# Patient Record
Sex: Male | Born: 1945 | Race: White | Hispanic: No | Marital: Married | Smoking: Former smoker
Health system: Southern US, Community
[De-identification: ages and names within clinical notes are randomized; demographics above are authoritative.]

## PROBLEM LIST (undated history)

## (undated) DIAGNOSIS — E785 Hyperlipidemia, unspecified: Secondary | ICD-10-CM

## (undated) DIAGNOSIS — I10 Essential (primary) hypertension: Secondary | ICD-10-CM

## (undated) DIAGNOSIS — I219 Acute myocardial infarction, unspecified: Secondary | ICD-10-CM

## (undated) DIAGNOSIS — C61 Malignant neoplasm of prostate: Secondary | ICD-10-CM

## (undated) DIAGNOSIS — C801 Malignant (primary) neoplasm, unspecified: Secondary | ICD-10-CM

## (undated) DIAGNOSIS — Z8489 Family history of other specified conditions: Secondary | ICD-10-CM

## (undated) DIAGNOSIS — S83289A Other tear of lateral meniscus, current injury, unspecified knee, initial encounter: Secondary | ICD-10-CM

## (undated) DIAGNOSIS — N2889 Other specified disorders of kidney and ureter: Secondary | ICD-10-CM

## (undated) DIAGNOSIS — M1990 Unspecified osteoarthritis, unspecified site: Secondary | ICD-10-CM

## (undated) DIAGNOSIS — I2510 Atherosclerotic heart disease of native coronary artery without angina pectoris: Secondary | ICD-10-CM

## (undated) HISTORY — DX: Essential (primary) hypertension: I10

## (undated) HISTORY — DX: Atherosclerotic heart disease of native coronary artery without angina pectoris: I25.10

## (undated) HISTORY — PX: ROBOT ASSISTED LAPAROSCOPIC RADICAL PROSTATECTOMY: SHX5141

## (undated) HISTORY — PX: DIAGNOSTIC LAPAROSCOPY: SUR761

## (undated) HISTORY — PX: CARDIAC CATHETERIZATION: SHX172

## (undated) HISTORY — DX: Other tear of lateral meniscus, current injury, unspecified knee, initial encounter: S83.289A

## (undated) HISTORY — PX: CORONARY ANGIOPLASTY: SHX604

## (undated) HISTORY — DX: Hyperlipidemia, unspecified: E78.5

---

## 2009-02-17 ENCOUNTER — Inpatient Hospital Stay: Payer: Self-pay | Admitting: Internal Medicine

## 2010-06-06 ENCOUNTER — Ambulatory Visit: Payer: Self-pay | Admitting: Gastroenterology

## 2014-10-16 DIAGNOSIS — I10 Essential (primary) hypertension: Secondary | ICD-10-CM

## 2014-10-16 HISTORY — DX: Essential (primary) hypertension: I10

## 2014-10-21 DIAGNOSIS — IMO0002 Reserved for concepts with insufficient information to code with codable children: Secondary | ICD-10-CM | POA: Insufficient documentation

## 2014-11-06 ENCOUNTER — Other Ambulatory Visit: Payer: Self-pay | Admitting: Surgery

## 2014-11-06 DIAGNOSIS — S83271A Complex tear of lateral meniscus, current injury, right knee, initial encounter: Secondary | ICD-10-CM

## 2014-11-06 DIAGNOSIS — M25561 Pain in right knee: Secondary | ICD-10-CM

## 2014-11-06 DIAGNOSIS — S83289A Other tear of lateral meniscus, current injury, unspecified knee, initial encounter: Secondary | ICD-10-CM

## 2014-11-06 HISTORY — DX: Other tear of lateral meniscus, current injury, unspecified knee, initial encounter: S83.289A

## 2014-11-12 ENCOUNTER — Ambulatory Visit
Admission: RE | Admit: 2014-11-12 | Discharge: 2014-11-12 | Disposition: A | Discharge status: Home / Self Care | Payer: Medicare Other | Source: Ambulatory Visit | Attending: Surgery | Admitting: Surgery

## 2014-11-12 DIAGNOSIS — T1490 Injury, unspecified: Secondary | ICD-10-CM | POA: Diagnosis not present

## 2014-11-12 DIAGNOSIS — S83251A Bucket-handle tear of lateral meniscus, current injury, right knee, initial encounter: Secondary | ICD-10-CM | POA: Diagnosis not present

## 2014-11-12 DIAGNOSIS — S83271A Complex tear of lateral meniscus, current injury, right knee, initial encounter: Secondary | ICD-10-CM

## 2014-11-12 DIAGNOSIS — M1711 Unilateral primary osteoarthritis, right knee: Secondary | ICD-10-CM | POA: Diagnosis not present

## 2014-11-12 DIAGNOSIS — M25561 Pain in right knee: Secondary | ICD-10-CM | POA: Diagnosis present

## 2014-11-12 DIAGNOSIS — M7121 Synovial cyst of popliteal space [Baker], right knee: Secondary | ICD-10-CM | POA: Insufficient documentation

## 2014-11-17 DIAGNOSIS — S83249A Other tear of medial meniscus, current injury, unspecified knee, initial encounter: Secondary | ICD-10-CM | POA: Insufficient documentation

## 2014-11-30 ENCOUNTER — Other Ambulatory Visit: Payer: Self-pay

## 2014-11-30 ENCOUNTER — Encounter
Admission: RE | Admit: 2014-11-30 | Discharge: 2014-11-30 | Disposition: A | Discharge status: Home / Self Care | Payer: Medicare Other | Source: Ambulatory Visit | Attending: Surgery | Admitting: Surgery

## 2014-11-30 DIAGNOSIS — E785 Hyperlipidemia, unspecified: Secondary | ICD-10-CM | POA: Diagnosis not present

## 2014-11-30 DIAGNOSIS — S83289A Other tear of lateral meniscus, current injury, unspecified knee, initial encounter: Secondary | ICD-10-CM | POA: Diagnosis present

## 2014-11-30 DIAGNOSIS — Z8249 Family history of ischemic heart disease and other diseases of the circulatory system: Secondary | ICD-10-CM | POA: Diagnosis not present

## 2014-11-30 DIAGNOSIS — M179 Osteoarthritis of knee, unspecified: Secondary | ICD-10-CM | POA: Diagnosis not present

## 2014-11-30 DIAGNOSIS — I10 Essential (primary) hypertension: Secondary | ICD-10-CM | POA: Diagnosis not present

## 2014-11-30 DIAGNOSIS — Z7982 Long term (current) use of aspirin: Secondary | ICD-10-CM | POA: Diagnosis not present

## 2014-11-30 DIAGNOSIS — Z87891 Personal history of nicotine dependence: Secondary | ICD-10-CM | POA: Diagnosis not present

## 2014-11-30 DIAGNOSIS — M23261 Derangement of other lateral meniscus due to old tear or injury, right knee: Secondary | ICD-10-CM | POA: Diagnosis not present

## 2014-11-30 DIAGNOSIS — I2510 Atherosclerotic heart disease of native coronary artery without angina pectoris: Secondary | ICD-10-CM | POA: Diagnosis not present

## 2014-11-30 DIAGNOSIS — M23221 Derangement of posterior horn of medial meniscus due to old tear or injury, right knee: Secondary | ICD-10-CM | POA: Diagnosis not present

## 2014-11-30 DIAGNOSIS — I252 Old myocardial infarction: Secondary | ICD-10-CM | POA: Diagnosis not present

## 2014-11-30 DIAGNOSIS — M94261 Chondromalacia, right knee: Secondary | ICD-10-CM | POA: Diagnosis not present

## 2014-11-30 DIAGNOSIS — Z79899 Other long term (current) drug therapy: Secondary | ICD-10-CM | POA: Diagnosis not present

## 2014-11-30 HISTORY — DX: Family history of other specified conditions: Z84.89

## 2014-11-30 HISTORY — DX: Unspecified osteoarthritis, unspecified site: M19.90

## 2014-11-30 HISTORY — DX: Atherosclerotic heart disease of native coronary artery without angina pectoris: I25.10

## 2014-11-30 HISTORY — DX: Acute myocardial infarction, unspecified: I21.9

## 2014-11-30 HISTORY — DX: Essential (primary) hypertension: I10

## 2014-11-30 LAB — CBC
HCT: 39.9 % — ABNORMAL LOW (ref 40.0–52.0)
Hemoglobin: 13.8 g/dL (ref 13.0–18.0)
MCH: 28.6 pg (ref 26.0–34.0)
MCHC: 34.5 g/dL (ref 32.0–36.0)
MCV: 82.8 fL (ref 80.0–100.0)
PLATELETS: 200 10*3/uL (ref 150–440)
RBC: 4.82 MIL/uL (ref 4.40–5.90)
RDW: 14.2 % (ref 11.5–14.5)
WBC: 8.4 10*3/uL (ref 3.8–10.6)

## 2014-11-30 LAB — POTASSIUM: Potassium: 4.1 mmol/L (ref 3.5–5.1)

## 2014-11-30 NOTE — Patient Instructions (Signed)
  Your procedure is scheduled on: 12/03/14 Report to Day Surgery. To find out your arrival time please call [phone] between 1PM - 3PM on 12/02/14.  Remember: Instructions that are not followed completely may result in serious medical risk, up to and including death, or upon the discretion of your surgeon and anesthesiologist your surgery may need to be rescheduled.    ____ 1. Do not eat food or drink liquids after midnight. No gum chewing or hard candies.     ____ 2. No Alcohol for 24 hours before or after surgery.   ____ 3. Bring all medications with you on the day of surgery if instructed.    ____ 4. Notify your doctor if there is any change in your medical condition     (cold, fever, infections).     Do not wear jewelry, make-up, hairpins, clips or nail polish.  Do not wear lotions, powders, or perfumes. You may wear deodorant.  Do not shave 48 hours prior to surgery. Men may shave face and neck.  Do not bring valuables to the hospital.    [facility] is not responsible for any belongings or valuables.               Contacts, dentures or bridgework may not be worn into surgery.  Leave your suitcase in the car. After surgery it may be brought to your room.  For patients admitted to the hospital, discharge time is determined by your                treatment team.   Patients discharged the day of surgery will not be allowed to drive home.   Please read over the following fact sheets that you were given:   Surgical Site Infection Prevention   ____ Take these medicines the morning of surgery with A SIP OF WATER:     1.  2.   3.   4.  5.  6.  ____ Fleet Enema (as directed)   _x___ Use CHG Soap as directed  ____ Use inhalers on the day of surgery  ____ Stop metformin 2 days prior to surgery    ____ Take 1/2 of usual insulin dose the night before surgery and none on the morning of surgery.   ____ Stop Coumadin/Plavix/aspirin on   STOPPING 1 DAY PREOP PATIENT STATES AS  INSTRUCTED BY DR Nehemiah Massed  _X___ Stop Anti-inflammatories on   NOW UNTIL AFTER SURGERY   ____ Stop supplements until after surgery.    ____ Bring C-Pap to the hospital.

## 2014-12-03 ENCOUNTER — Ambulatory Visit
Admission: RE | Admit: 2014-12-03 | Discharge: 2014-12-03 | Disposition: A | Discharge status: Home / Self Care | Payer: Medicare Other | Source: Ambulatory Visit | Attending: Surgery | Admitting: Surgery

## 2014-12-03 ENCOUNTER — Ambulatory Visit: Payer: Medicare Other | Admitting: Registered Nurse

## 2014-12-03 ENCOUNTER — Encounter
Admission: RE | Disposition: A | Discharge status: Home / Self Care | Payer: Self-pay | Source: Ambulatory Visit | Attending: Surgery

## 2014-12-03 ENCOUNTER — Encounter: Payer: Self-pay | Admitting: *Deleted

## 2014-12-03 DIAGNOSIS — I10 Essential (primary) hypertension: Secondary | ICD-10-CM | POA: Insufficient documentation

## 2014-12-03 DIAGNOSIS — M179 Osteoarthritis of knee, unspecified: Secondary | ICD-10-CM | POA: Insufficient documentation

## 2014-12-03 DIAGNOSIS — M94261 Chondromalacia, right knee: Secondary | ICD-10-CM | POA: Insufficient documentation

## 2014-12-03 DIAGNOSIS — Z87891 Personal history of nicotine dependence: Secondary | ICD-10-CM | POA: Insufficient documentation

## 2014-12-03 DIAGNOSIS — M23261 Derangement of other lateral meniscus due to old tear or injury, right knee: Secondary | ICD-10-CM | POA: Insufficient documentation

## 2014-12-03 DIAGNOSIS — I2510 Atherosclerotic heart disease of native coronary artery without angina pectoris: Secondary | ICD-10-CM | POA: Insufficient documentation

## 2014-12-03 DIAGNOSIS — Z7982 Long term (current) use of aspirin: Secondary | ICD-10-CM | POA: Insufficient documentation

## 2014-12-03 DIAGNOSIS — I252 Old myocardial infarction: Secondary | ICD-10-CM | POA: Insufficient documentation

## 2014-12-03 DIAGNOSIS — M23221 Derangement of posterior horn of medial meniscus due to old tear or injury, right knee: Secondary | ICD-10-CM | POA: Insufficient documentation

## 2014-12-03 DIAGNOSIS — E785 Hyperlipidemia, unspecified: Secondary | ICD-10-CM | POA: Insufficient documentation

## 2014-12-03 DIAGNOSIS — Z79899 Other long term (current) drug therapy: Secondary | ICD-10-CM | POA: Insufficient documentation

## 2014-12-03 DIAGNOSIS — M1710 Unilateral primary osteoarthritis, unspecified knee: Secondary | ICD-10-CM | POA: Insufficient documentation

## 2014-12-03 DIAGNOSIS — Z8249 Family history of ischemic heart disease and other diseases of the circulatory system: Secondary | ICD-10-CM | POA: Insufficient documentation

## 2014-12-03 HISTORY — PX: KNEE ARTHROSCOPY WITH MENISCAL REPAIR: SHX5653

## 2014-12-03 SURGERY — ARTHROSCOPY, KNEE, WITH MENISCUS REPAIR
Anesthesia: General | Laterality: Right | Wound class: Clean

## 2014-12-03 MED ORDER — FENTANYL CITRATE (PF) 100 MCG/2ML IJ SOLN
INTRAMUSCULAR | Status: AC
  Filled 2014-12-03: qty 2

## 2014-12-03 MED ORDER — LIDOCAINE HCL (PF) 1 % IJ SOLN
INTRAMUSCULAR | Status: AC
  Filled 2014-12-03: qty 30

## 2014-12-03 MED ORDER — OXYCODONE HCL 5 MG PO TABS
5.0000 mg | ORAL_TABLET | ORAL | Status: DC | PRN
  Administered 2014-12-03: 5 mg via ORAL

## 2014-12-03 MED ORDER — ONDANSETRON HCL 4 MG/2ML IJ SOLN
INTRAMUSCULAR | Status: DC | PRN
  Administered 2014-12-03: 4 mg via INTRAVENOUS

## 2014-12-03 MED ORDER — LACTATED RINGERS IV SOLN
INTRAVENOUS | Status: DC
  Administered 2014-12-03: 09:00:00 via INTRAVENOUS

## 2014-12-03 MED ORDER — PHENYLEPHRINE HCL 10 MG/ML IJ SOLN
INTRAMUSCULAR | Status: DC | PRN
  Administered 2014-12-03 (×5): 100 ug via INTRAVENOUS

## 2014-12-03 MED ORDER — LIDOCAINE HCL 1 % IJ SOLN
INTRAMUSCULAR | Status: DC | PRN
  Administered 2014-12-03: 30 mL

## 2014-12-03 MED ORDER — ACETAMINOPHEN 10 MG/ML IV SOLN
INTRAVENOUS | Status: DC | PRN
  Administered 2014-12-03: 1000 mg via INTRAVENOUS

## 2014-12-03 MED ORDER — OXYCODONE HCL 5 MG PO TABS: 5.0000 mg | ORAL_TABLET | ORAL | Status: DC | PRN

## 2014-12-03 MED ORDER — FENTANYL CITRATE (PF) 100 MCG/2ML IJ SOLN
INTRAMUSCULAR | Status: DC | PRN
  Administered 2014-12-03: 50 ug via INTRAVENOUS

## 2014-12-03 MED ORDER — FAMOTIDINE 20 MG PO TABS
20.0000 mg | ORAL_TABLET | Freq: Once | ORAL | Status: AC
  Administered 2014-12-03: 20 mg via ORAL

## 2014-12-03 MED ORDER — OXYCODONE HCL 5 MG PO TABS
ORAL_TABLET | ORAL | Status: AC
  Filled 2014-12-03: qty 1

## 2014-12-03 MED ORDER — FAMOTIDINE 20 MG PO TABS
ORAL_TABLET | ORAL | Status: AC
  Administered 2014-12-03: 20 mg via ORAL
  Filled 2014-12-03: qty 1

## 2014-12-03 MED ORDER — PROPOFOL 10 MG/ML IV BOLUS
INTRAVENOUS | Status: DC | PRN
  Administered 2014-12-03: 200 mg via INTRAVENOUS
  Administered 2014-12-03: 50 mg via INTRAVENOUS

## 2014-12-03 MED ORDER — BUPIVACAINE-EPINEPHRINE (PF) 0.5% -1:200000 IJ SOLN
INTRAMUSCULAR | Status: AC
  Filled 2014-12-03: qty 30

## 2014-12-03 MED ORDER — LIDOCAINE HCL (CARDIAC) 20 MG/ML IV SOLN
INTRAVENOUS | Status: DC | PRN
  Administered 2014-12-03: 100 mg via INTRAVENOUS

## 2014-12-03 MED ORDER — ACETAMINOPHEN 10 MG/ML IV SOLN
INTRAVENOUS | Status: AC
  Filled 2014-12-03: qty 100

## 2014-12-03 MED ORDER — ONDANSETRON HCL 4 MG/2ML IJ SOLN: 4.0000 mg | Freq: Once | INTRAMUSCULAR | Status: DC | PRN

## 2014-12-03 MED ORDER — CEFAZOLIN SODIUM-DEXTROSE 2-3 GM-% IV SOLR
2.0000 g | INTRAVENOUS | Status: AC
  Administered 2014-12-03: 2 g via INTRAVENOUS
  Filled 2014-12-03: qty 50

## 2014-12-03 MED ORDER — HYDROMORPHONE HCL 1 MG/ML IJ SOLN: 0.2500 mg | INTRAMUSCULAR | Status: DC | PRN

## 2014-12-03 MED ORDER — GLYCOPYRROLATE 0.2 MG/ML IJ SOLN
INTRAMUSCULAR | Status: DC | PRN
  Administered 2014-12-03: 0.2 mg via INTRAVENOUS

## 2014-12-03 MED ORDER — BUPIVACAINE-EPINEPHRINE (PF) 0.5% -1:200000 IJ SOLN
INTRAMUSCULAR | Status: DC | PRN
  Administered 2014-12-03: 50 mL

## 2014-12-03 MED ORDER — MIDAZOLAM HCL 2 MG/2ML IJ SOLN
INTRAMUSCULAR | Status: DC | PRN
  Administered 2014-12-03: 2 mg via INTRAVENOUS

## 2014-12-03 MED ORDER — CHLORHEXIDINE GLUCONATE 4 % EX LIQD: 60.0000 mL | Freq: Once | CUTANEOUS | Status: DC

## 2014-12-03 MED ORDER — FENTANYL CITRATE (PF) 100 MCG/2ML IJ SOLN
25.0000 ug | INTRAMUSCULAR | Status: DC | PRN
  Administered 2014-12-03 (×2): 25 ug via INTRAVENOUS

## 2014-12-03 SURGICAL SUPPLY — 35 items
BAG COUNTER SPONGE EZ (MISCELLANEOUS) IMPLANT
BANDAGE ELASTIC 6 CLIP ST LF (GAUZE/BANDAGES/DRESSINGS) ×3 IMPLANT
BLADE FULL RADIUS 3.5 (BLADE) ×3 IMPLANT
BLADE INCISOR PLUS 4.5 (BLADE) ×3 IMPLANT
BLADE SHAVER 4.5X7 STR FR (MISCELLANEOUS) ×3 IMPLANT
CHLORAPREP W/TINT 26ML (MISCELLANEOUS) ×3 IMPLANT
COUNTER SPONGE BAG EZ (MISCELLANEOUS)
FASTFIX NDL DEL SYS 360 CVD (Miscellaneous) ×12 IMPLANT
FASTFIX NDL DEL SYS 360 STRT (Miscellaneous) ×3 IMPLANT
GAUZE SPONGE 4X4 12PLY STRL (GAUZE/BANDAGES/DRESSINGS) ×3 IMPLANT
GLOVE BIO SURGEON STRL SZ8 (GLOVE) ×6 IMPLANT
GLOVE BIOGEL M 7.0 STRL (GLOVE) ×6 IMPLANT
GLOVE BIOGEL PI IND STRL 7.5 (GLOVE) ×1 IMPLANT
GLOVE BIOGEL PI INDICATOR 7.5 (GLOVE) ×2
GLOVE INDICATOR 8.0 STRL GRN (GLOVE) ×3 IMPLANT
GOWN STRL REUS W/ TWL LRG LVL3 (GOWN DISPOSABLE) ×1 IMPLANT
GOWN STRL REUS W/ TWL XL LVL3 (GOWN DISPOSABLE) ×2 IMPLANT
GOWN STRL REUS W/TWL LRG LVL3 (GOWN DISPOSABLE) ×2
GOWN STRL REUS W/TWL XL LVL3 (GOWN DISPOSABLE) ×4
IV LACTATED RINGER IRRG 3000ML (IV SOLUTION) ×2
IV LR IRRIG 3000ML ARTHROMATIC (IV SOLUTION) ×1 IMPLANT
KIT RM TURNOVER STRD PROC AR (KITS) ×3 IMPLANT
MANIFOLD NEPTUNE II (INSTRUMENTS) ×3 IMPLANT
NEEDLE HYPO 21X1.5 SAFETY (NEEDLE) ×3 IMPLANT
PACK ARTHROSCOPY KNEE (MISCELLANEOUS) ×3 IMPLANT
PAD GROUND ADULT SPLIT (MISCELLANEOUS) ×3 IMPLANT
PENCIL ELECTRO HAND CTR (MISCELLANEOUS) ×3 IMPLANT
PUSHER KNOT ARTHRO 360DEG (Miscellaneous) ×3 IMPLANT
SUT PROLENE 4 0 PS 2 18 (SUTURE) ×3 IMPLANT
SUT TI-CRON 2-0 W/10 SWGD (SUTURE) IMPLANT
SYR 50ML LL SCALE MARK (SYRINGE) ×3 IMPLANT
SYSTEM NDL DEL FSTFX  360 CVD (Miscellaneous) ×4 IMPLANT
SYSTEM NDL DEL FSTFX  360 STRT (Miscellaneous) ×1 IMPLANT
TUBING ARTHRO INFLOW-ONLY STRL (TUBING) ×3 IMPLANT
WAND HAND CNTRL MULTIVAC 90 (MISCELLANEOUS) ×3 IMPLANT

## 2014-12-03 NOTE — H&P (Signed)
Paper H&P to be scanned into permanent record. H&P reviewed. No changes. 

## 2014-12-03 NOTE — Transfer of Care (Signed)
Immediate Anesthesia Transfer of Care Note  Patient: Marc Macias  Procedure(s) Performed: Procedure(s): KNEE ARTHROSCOPY WITH MENISCAL REPAIR, Partial medial meniscectomy, Lateral meniscus repair with partial meniscectomy, Abrasion chondraplasty of Femoral tropia (Right)  Patient Location: PACU  Anesthesia Type:General  Level of Consciousness: sedated  Airway & Oxygen Therapy: Patient Spontanous Breathing and Patient connected to face mask oxygen  Post-op Assessment: Report given to RN and Post -op Vital signs reviewed and stable  Post vital signs: Reviewed and stable  Last Vitals:  Filed Vitals:   12/03/14 1212  BP: 133/72  Pulse: 64  Temp: 36.1 C  Resp: 13    Complications: No apparent anesthesia complications

## 2014-12-03 NOTE — Op Note (Signed)
12/03/2014  11:59 AM  Patient:   Marc Macias  Pre-Op Diagnosis:   Degenerative joint disease, medial meniscus tear, and displaced bucket-handle tear of lateral meniscus, right knee.  Postoperative diagnosis:   Same.  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  Surgeon:   Pascal Lux, M.D.  Anesthesia:   General LMA.  Findings:   As above. There also were grade 2-3 chondral malacia changes involving the inferior portion of the central ridge of the patella and grade 3-4 chondral malacia changes involving the posterior portion of the tibial plateau and the lateral portion of the weightbearing surface of the lateral femoral condyle. The anterior and posterior cruciate ligaments both were in satisfactory condition.  Complications:   None.  EBL:   1 cc.  Total fluids:   700 cc of crystalloid.  Tourniquet time:   None  Drains:   None  Closure:   4-0 Prolene interrupted sutures.  Brief clinical note:   The patient is a 69 year old male with a history of right knee pain following a twisting injury. His symptoms have persisted despite medications, activity modification, etc. His history and examination are consistent with a meniscal tear. An MRI scan demonstrated both a posterior horn medial meniscus tear, as well as a displaced bucket-handle tear of the lateral meniscus. He presents at this time for arthroscopy, debridement, and repair versus partial medial and/or lateral meniscectomies.  Procedure:   The patient was brought into the operating room and lain in the supine position. After adequate general laryngal mask anesthesia was obtained, a timeout was performed to verify the appropriate side. The patient's right knee was injected sterilely using a solution of 30 cc of 1% lidocaine and 30 cc  of 0.5% Sensorcaine with epinephrine. The right lower extremity was prepped with DuraPrep solution before being draped sterilely. Preoperative antibiotics were administered. The expected portal sites were injected with 0.5% Sensorcaine with epinephrine before the camera was placed in the anterolateral portal and instrumentation performed through the anteromedial portal. The knee was sequentially examined beginning in the suprapatellar pouch, then progressing to the patellofemoral space, the medial gutter compartment, the notch, and finally the lateral compartment and gutter. The findings were as described above. Abundant reactive synovial tissues anteriorly were debrided using the full-radius resector in order to improve visualization. The degenerative tear of the posterior horn to be meniscus was to be defect stable margins using a combination of the up-biting mini-munchers and full-radius resector. Subsequent probing the remaining rim demonstrated good stability. A focal area of grade 3 chondromalacia involving the central weight-bearing portion of the medial femoral condyle was debrided back to stable margins using the full-radius resector. The area of grade 3 chondromalacia involving the femoral trochlea also was treated back to stable margins using a full-radius resector. Laterally, there was a displaced bucket-handle tear. The meniscus was reduced but there was significant degenerative changes involving the central and posterior lateral portions of the meniscus. These areas were debrided back to stable margins using the full-radius resector and mini-munchers. Given that such a large portion of the meniscus was involved and raising the concern that there would be lateral instability as well as complete functional insufficiency of the remaining meniscus if the entire posterior portion of this lateral meniscus was removed, the peripheral rim was repaired using five FasT-Fix 360 meniscal repair devices. Subsequent  probing  of the repaired rim demonstrated excellent stability. The instruments were removed from the joint after suctioning the excess fluid. The portal sites were closed using 4-0 Prolene interrupted sutures before a sterile bulky dressing was applied to the knee. The patient was then awakened, extubated, and returned to the recovery room in satisfactory condition after tolerating the procedure well.

## 2014-12-03 NOTE — Discharge Instructions (Addendum)
Keep dressing dry and intact.  May shower after dressing changed on post-op day #4 (Monday).  Cover staples/sutures with Band-Aids after drying off. Apply ice frequently to knee. May weight-bear as tolerated - use crutches or walker as needed. Follow-up in 10-14 days or as scheduled.    AMBULATORY SURGERY  DISCHARGE INSTRUCTIONS   1) The drugs that you were given will stay in your system until tomorrow so for the next 24 hours you should not:  A) Drive an automobile B) Make any legal decisions C) Drink any alcoholic beverage   2) You may resume regular meals tomorrow.  Today it is better to start with liquids and gradually work up to solid foods.  You may eat anything you prefer, but it is better to start with liquids, then soup and crackers, and gradually work up to solid foods.   3) Please notify your doctor immediately if you have any unusual bleeding, trouble breathing, redness and pain at the surgery site, drainage, fever, or pain not relieved by medication.    4) Additional Instructions:        Please contact your physician with any problems or Same Day Surgery at [phone], Monday through Friday 6 am to 4 pm, or [place] at [facility] number at [phone].

## 2014-12-03 NOTE — Anesthesia Preprocedure Evaluation (Signed)
Anesthesia Evaluation  Patient identified by MRN, date of birth, ID band Patient awake    Reviewed: Allergy & Precautions, H&P , NPO status , Patient's Chart, lab work & pertinent test results, reviewed documented beta blocker date and time   Airway Mallampati: III  TM Distance: >3 FB Neck ROM: full    Dental  (+) Teeth Intact   Pulmonary former smoker,          Cardiovascular hypertension, + CAD and + Past MI Normal cardiovascular examRhythm:regular Rate:Normal     Neuro/Psych    GI/Hepatic   Endo/Other    Renal/GU      Musculoskeletal   Abdominal   Peds  Hematology   Anesthesia Other Findings   Reproductive/Obstetrics                             Anesthesia Physical Anesthesia Plan  ASA: III  Anesthesia Plan: General LMA   Post-op Pain Management:    Induction:   Airway Management Planned:   Additional Equipment:   Intra-op Plan:   Post-operative Plan:   Informed Consent: I have reviewed the patients History and Physical, chart, labs and discussed the procedure including the risks, benefits and alternatives for the proposed anesthesia with the patient or authorized representative who has indicated his/her understanding and acceptance.     Plan Discussed with: CRNA  Anesthesia Plan Comments:         Anesthesia Quick Evaluation

## 2014-12-03 NOTE — Anesthesia Procedure Notes (Signed)
Procedure Name: LMA Insertion Date/Time: 12/03/2014 10:44 AM Performed by: Doreen Salvage Pre-anesthesia Checklist: Patient identified, Patient being monitored, Timeout performed, Emergency Drugs available and Suction available Patient Re-evaluated:Patient Re-evaluated prior to inductionOxygen Delivery Method: Circle system utilized Preoxygenation: Pre-oxygenation with 100% oxygen Intubation Type: IV induction Ventilation: Mask ventilation without difficulty LMA: LMA inserted LMA Size: 4.5 Tube type: Oral Number of attempts: 1 Placement Confirmation: positive ETCO2 and breath sounds checked- equal and bilateral Tube secured with: Tape Dental Injury: Teeth and Oropharynx as per pre-operative assessment

## 2014-12-04 NOTE — Anesthesia Postprocedure Evaluation (Signed)
  Anesthesia Post-op Note  Patient: Marc Macias  Procedure(s) Performed: Procedure(s): KNEE ARTHROSCOPY WITH MENISCAL REPAIR, Partial medial meniscectomy, Lateral meniscus repair with partial meniscectomy, Abrasion chondraplasty of Femoral tropia (Right)  Anesthesia type:General LMA  Patient location: PACU  Post pain: Pain level controlled  Post assessment: Post-op Vital signs reviewed, Patient's Cardiovascular Status Stable, Respiratory Function Stable, Patent Airway and No signs of Nausea or vomiting  Post vital signs: Reviewed and stable  Last Vitals:  Filed Vitals:   12/03/14 1345  BP: 136/78  Pulse: 56  Temp:   Resp: 16    Level of consciousness: awake, alert  and patient cooperative  Complications: No apparent anesthesia complications

## 2014-12-08 ENCOUNTER — Ambulatory Visit: Payer: Self-pay

## 2014-12-24 ENCOUNTER — Encounter: Payer: Self-pay | Admitting: Urology

## 2014-12-24 ENCOUNTER — Other Ambulatory Visit: Payer: Self-pay | Admitting: Urology

## 2014-12-24 ENCOUNTER — Ambulatory Visit (INDEPENDENT_AMBULATORY_CARE_PROVIDER_SITE_OTHER): Payer: Medicare Other | Admitting: Urology

## 2014-12-24 VITALS — BP 165/80 | HR 83 | Ht 72.0 in | Wt 260.5 lb

## 2014-12-24 DIAGNOSIS — R972 Elevated prostate specific antigen [PSA]: Secondary | ICD-10-CM | POA: Diagnosis not present

## 2014-12-24 NOTE — Progress Notes (Signed)
12/24/2014 2:38 PM   Marc Macias Apr 18, 1946 [id number]  Referring provider: [name], MD [place], [place] [address]  Chief Complaint  Patient presents with  . Elevated PSA    referred by Dr. Jacquen[facility]    HPI: Patient seen first time by urology has never had a PSA to recently. His 14.59 and he is only 69 years old. He is active sexually has no voiding problems of urgency frequency dysuria pyuria gross hematuria. No history of calculi. No family history of [place] cancer. Patient understood the PSA was a screening test and that his screening evaluation is elevated. Another PSA was drawn today to make sure we do not have lab error on the first PSA. HPI   PMH: Past Medical History  Diagnosis Date  . Family history of adverse reaction to anesthesia   . Coronary artery disease     stent 2008  . Myocardial infarction     2008  . Hypertension   . Arthritis   . HLD (hyperlipidemia)     Surgical History: Past Surgical History  Procedure Laterality Date  . Knee arthroscopy with meniscal repair Right 12/03/2014    Procedure: KNEE ARTHROSCOPY WITH MENISCAL REPAIR, Partial medial meniscectomy, Lateral meniscus repair with partial meniscectomy, Abrasion chondraplasty of Femoral tropia;  Surgeon: Corky Mull, MD;  Location: [facility] ORS;  Service: Orthopedics;  Laterality: Right;    Home Medications:    Medication List       This list is accurate as of: 12/24/14  2:38 PM.  Always use your most recent med list.               ADVIL PM PO  Take by mouth at bedtime.     aspirin 81 MG tablet  Take 81 mg by mouth daily.     atorvastatin 20 MG tablet  Commonly known as:  LIPITOR  Take 20 mg by mouth daily.     lisinopril-hydrochlorothiazide 20-12.5 MG per tablet  Commonly known as:  PRINZIDE,ZESTORETIC  Take 1 tablet by mouth daily.     oxyCODONE 5 MG immediate release tablet  Commonly known as:  ROXICODONE  Take 1-2 tablets (5-10 mg  total) by mouth every 4 (four) hours as needed for severe pain.        Allergies: No Known Allergies  Family History: Family History  Problem Relation Age of Onset  . Benign prostatic hyperplasia Father   . Kidney disease Neg Hx   . Prostate cancer Neg Hx   . Bladder Cancer Neg Hx     Social History:  reports that he has quit smoking. He does not have any smokeless tobacco history on file. He reports that he drinks alcohol. He reports that he does not use illicit drugs.  ROS: UROLOGY Frequent Urination?: No Hard to postpone urination?: No Burning/pain with urination?: No Get up at night to urinate?: Yes Leakage of urine?: Yes Urine stream starts and stops?: No Trouble starting stream?: No Do you have to strain to urinate?: No Blood in urine?: No Urinary tract infection?: No Sexually transmitted disease?: No Injury to kidneys or bladder?: No Painful intercourse?: No Weak stream?: No Erection problems?: No Penile pain?: No  Gastrointestinal Nausea?: No Vomiting?: No Indigestion/heartburn?: No Diarrhea?: No Constipation?: No  Constitutional Fever: No Night sweats?: No Weight loss?: No Fatigue?: No  Skin Skin rash/lesions?: No Itching?: No  Eyes Blurred vision?: No Double vision?: No  Ears/Nose/Throat Sore throat?: No Sinus problems?: No  Hematologic/Lymphatic Swollen glands?: No Easy bruising?: No  Cardiovascular Leg swelling?: No Chest pain?: No  Respiratory Cough?: No Shortness of breath?: No  Endocrine Excessive thirst?: No  Musculoskeletal Back pain?: No Joint pain?: No  Neurological Headaches?: No Dizziness?: No  Psychologic Depression?: No Anxiety?: No  Physical Exam: BP 165/80 mmHg  Pulse 83  Ht 6' (1.829 m)  Wt 260 lb 8 oz (118.162 kg)  BMI 35.32 kg/m2  Constitutional:  Alert and oriented, No acute distress. HEENT: [place] AT, moist mucus membranes.  Trachea midline, no masses. Cardiovascular: No clubbing, cyanosis, or  edema. Respiratory: Normal respiratory effort, no increased work of breathing. GI: Abdomen is soft, nontender, nondistended, no abdominal masses GU: No CVA tenderness. Grade 2 over 4 prostatic hypertrophy with no nodules masses or asymmetry. Rectal exam shows no rectal masses. Testes are descended and normal. Patient's penis has a partial circumcision easily retractable foreskin with no penile lesions. Skin: No rashes, bruises or suspicious lesions. Lymph: No cervical or inguinal adenopathy. Neurologic: Grossly intact, no focal deficits, moving all 4 extremities. Psychiatric: Normal mood and affect.  Laboratory Data: Lab Results  Component Value Date   WBC 8.4 11/30/2014   HGB 13.8 11/30/2014   HCT 39.9* 11/30/2014   MCV 82.8 11/30/2014   PLT 200 11/30/2014    No results found for: CREATININE  No results found for: PSA  No results found for: TESTOSTERONE  No results found for: HGBA1C  Urinalysis No results found for: COLORURINE, APPEARANCEUR, LABSPEC, PHURINE, GLUCOSEU, HGBUR, BILIRUBINUR, KETONESUR, PROTEINUR, UROBILINOGEN, NITRITE, LEUKOCYTESUR  Pertinent Imaging: None  Assessment and Plan: Elevated PSA of 14.59 in a patient with a normal feeling prostate plan outpatient office transrectal ultrasound-guided biopsy discussed this with patient as to complications and need to eat before he has biopsy. Patient needs to be off any aspirin for a week      Problem List Items Addressed This Visit    None    Visit Diagnoses    Elevated PSA    -  Primary    Relevant Orders    PSA       No Follow-up on file.  Collier Flowers, [facility] [address] [place], [place] [address] [phone]

## 2014-12-25 ENCOUNTER — Telehealth: Payer: Self-pay | Admitting: Urology

## 2014-12-25 LAB — PSA: Prostate Specific Ag, Serum: 15.3 ng/mL — ABNORMAL HIGH (ref 0.0–4.0)

## 2014-12-31 ENCOUNTER — Telehealth: Payer: Self-pay | Admitting: Urology

## 2014-12-31 NOTE — Telephone Encounter (Signed)
Called patient and spoke to him in regards to his prostate biopsy. We were able to get clearance from Dr. Nehemiah Massed for patient to stop his ASA 81mg  7 days prior to the procedure. Patient was notified of this and verbalized understanding. He was also reminded of the pre-biopsy instructions of the fleets enema, eating a light meal, and having a driver if possible.

## 2015-01-29 ENCOUNTER — Ambulatory Visit (INDEPENDENT_AMBULATORY_CARE_PROVIDER_SITE_OTHER): Payer: Medicare Other | Admitting: Urology

## 2015-01-29 ENCOUNTER — Encounter: Payer: Self-pay | Admitting: Urology

## 2015-01-29 VITALS — BP 135/80 | HR 69 | Ht 72.0 in | Wt 261.6 lb

## 2015-01-29 DIAGNOSIS — R972 Elevated prostate specific antigen [PSA]: Secondary | ICD-10-CM | POA: Diagnosis not present

## 2015-01-29 DIAGNOSIS — E782 Mixed hyperlipidemia: Secondary | ICD-10-CM | POA: Insufficient documentation

## 2015-01-29 DIAGNOSIS — I2510 Atherosclerotic heart disease of native coronary artery without angina pectoris: Secondary | ICD-10-CM

## 2015-01-29 HISTORY — DX: Atherosclerotic heart disease of native coronary artery without angina pectoris: I25.10

## 2015-01-29 MED ORDER — GENTAMICIN SULFATE 40 MG/ML IJ SOLN
80.0000 mg | Freq: Once | INTRAMUSCULAR | Status: AC
  Administered 2015-01-29: 80 mg via INTRAMUSCULAR

## 2015-01-29 MED ORDER — LEVOFLOXACIN 500 MG PO TABS
500.0000 mg | ORAL_TABLET | Freq: Once | ORAL | Status: AC
  Administered 2015-01-29: 500 mg via ORAL

## 2015-01-29 NOTE — Progress Notes (Signed)
Prostate Biopsy Procedure   Informed consent was obtained after discussing risks/benefits of the procedure.  A time out was performed to ensure correct patient identity.  Pre-Procedure: - Last PSA Level:  Lab Results  Component Value Date   PSA 15.3* 12/24/2014   - Gentamicin given prophylactically - Levaquin 500 mg administered PO -Transrectal Ultrasound performed revealing a  39 g  gm prostate -No significant hypoechoic or median lobe noted  Procedure: - Prostate block performed using 10 cc 1% lidocaine and biopsies taken from sextant areas, a total of 12 under ultrasound guidance.  Post-Procedure: - Patient tolerated the procedure well - He was counseled to seek immediate medical attention if experiences any severe pain, significant bleeding, or fevers - Return in one week to discuss biopsy results

## 2015-02-04 LAB — PATHOLOGY REPORT

## 2015-02-05 ENCOUNTER — Ambulatory Visit (INDEPENDENT_AMBULATORY_CARE_PROVIDER_SITE_OTHER): Payer: Medicare Other | Admitting: Urology

## 2015-02-05 VITALS — BP 162/82 | HR 93 | Ht 72.0 in | Wt 260.0 lb

## 2015-02-05 DIAGNOSIS — C61 Malignant neoplasm of prostate: Secondary | ICD-10-CM

## 2015-02-05 NOTE — Progress Notes (Signed)
02/05/2015 9:00 AM   Marc Macias 1945/12/16 [id number]  Referring provider: [name], MD [place],  [id number]  Chief Complaint  Patient presents with  . 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 He does have h/o MI/Stent but now only on ASA and no funcitonal limitations whatsoever. No abd or chest surgeries. No bothersome lower urinary tract symptoms. Moderate truncal obesity.   Today "Marc Macias" is seen in f/u above and discuss biopsy results.    PMH: Past Medical History  Diagnosis Date  . Family history of adverse reaction to anesthesia   . Coronary artery disease     stent 2008  . Myocardial infarction     2008  . Hypertension   . Arthritis   . HLD (hyperlipidemia)   . Benign essential HTN 10/16/2014    Last Assessment & Plan:  Blood pressure has been controlled without significant dizzyness or associated fatigue. Taking antihypertensives as directed without difficulty. Improved over time.    . Arteriosclerosis of coronary artery 01/29/2015    Overview:  Status post anteroapical myocardial infarction, PCI and stent placement LAD 02/2009   Last Assessment & Plan:  Status post anteroapical myocardial infarction, PCI and stent placement LAD 02/2009. Not having significant exertional chest pain. Taking medications for coronary disease without problems. Myalgia's are not present.    . Current tear knee, lateral meniscus 11/06/2014    Surgical History: Past Surgical History  Procedure Laterality Date  . Knee arthroscopy with meniscal repair Right 12/03/2014    Procedure: KNEE ARTHROSCOPY WITH MENISCAL REPAIR, Partial medial meniscectomy, Lateral meniscus repair with partial meniscectomy, Abrasion chondraplasty of Femoral tropia;  Surgeon: Corky Mull, MD;  Location: [facility] ORS;  Service: Orthopedics;   Laterality: Right;    Home Medications:    Medication List       This list is accurate as of: 02/05/15  9:00 AM.  Always use your most recent med list.               ADVIL PM PO  Take by mouth at bedtime.     aspirin 81 MG tablet  Take 81 mg by mouth daily.     atorvastatin 20 MG tablet  Commonly known as:  LIPITOR  Take 20 mg by mouth daily.     lisinopril-hydrochlorothiazide 20-12.5 MG per tablet  Commonly known as:  PRINZIDE,ZESTORETIC  Take 1 tablet by mouth daily.        Allergies: No Known Allergies  Family History: Family History  Problem Relation Age of Onset  . Benign prostatic hyperplasia Father   . Kidney disease Neg Hx   . Prostate cancer Neg Hx   . Bladder Cancer Neg Hx     Social History:  reports that he has quit smoking. He does not have any smokeless tobacco history on file. He reports that he drinks alcohol. He reports that he does not use illicit drugs.  ROS: UROLOGY Frequent Urination?: No Hard to postpone urination?: No Burning/pain with urination?: No Get up at night to urinate?: No Leakage of urine?: No Urine stream starts and stops?: No Trouble starting stream?: No Do you have to strain to urinate?: No Blood in urine?: No Urinary tract infection?: No Sexually transmitted disease?: No Injury to kidneys or bladder?: No Painful  intercourse?: No Weak stream?: No Erection problems?: No Penile pain?: No  Gastrointestinal Nausea?: No Vomiting?: No Indigestion/heartburn?: No Diarrhea?: No Constipation?: No  Constitutional Fever: No Night sweats?: No Weight loss?: No Fatigue?: No  Skin Skin rash/lesions?: No Itching?: No  Eyes Blurred vision?: No Double vision?: No  Ears/Nose/Throat Sore throat?: No Sinus problems?: No  Hematologic/Lymphatic Swollen glands?: No Easy bruising?: No  Cardiovascular Leg swelling?: No Chest pain?: No  Respiratory Cough?: No Shortness of breath?: No  Endocrine Excessive  thirst?: No  Musculoskeletal Back pain?: No Joint pain?: No  Neurological Headaches?: No Dizziness?: No  Psychologic Depression?: No Anxiety?: No  Physical Exam: BP 162/82 mmHg  Pulse 93  Ht 6' (1.829 m)  Wt 260 lb (117.935 kg)  BMI 35.25 kg/m2  Constitutional:  Alert and oriented, No acute distress. HEENT: [place] AT, moist mucus membranes.  Trachea midline, no masses. Cardiovascular: No clubbing, cyanosis, or edema. Respiratory: Normal respiratory effort, no increased work of breathing. GI: Abdomen is soft, nontender, nondistended, no abdominal masses GU: No CVA tenderness.  Skin: No rashes, bruises or suspicious lesions. Lymph: No cervical or inguinal adenopathy. Neurologic: Grossly intact, no focal deficits, moving all 4 extremities. Psychiatric: Normal mood and affect.  Laboratory Data: Lab Results  Component Value Date   WBC 8.4 11/30/2014   HGB 13.8 11/30/2014   HCT 39.9* 11/30/2014   MCV 82.8 11/30/2014   PLT 200 11/30/2014    No results found for: CREATININE  Lab Results  Component Value Date   PSA 15.3* 12/24/2014    No results found for: TESTOSTERONE  No results found for: HGBA1C  Urinalysis No results found for: COLORURINE, APPEARANCEUR, LABSPEC, PHURINE, GLUCOSEU, HGBUR, BILIRUBINUR, KETONESUR, PROTEINUR, UROBILINOGEN, NITRITE, LEUKOCYTESUR  Pertinent Imaging: none  Assessment & Plan:    1. Moderate Risk Prostate Cancer -  Discussed management options in detail form sureillance (not reccomended given volume / risk of disease and little comorbidity), surgery, and primary radiaoitn in detail with pt and wife. He wants active treatment but is not sure with surgery v. Radiation. He would likely do well with either. He has good friend who just went through surgery with good outcome and another who is happy with radiation.  I suggested obtaining rad-onc opinion and then meeting again to discuss his final decision. Answered pt and wife's questions.     Return in about 4 weeks (around 03/05/2015).  Alexis Frock, [facility] [address] [place], [place] [address] [phone]

## 2015-02-08 ENCOUNTER — Other Ambulatory Visit: Payer: Self-pay | Admitting: Urology

## 2015-02-08 ENCOUNTER — Telehealth: Payer: Self-pay | Admitting: Radiology

## 2015-02-08 NOTE — Telephone Encounter (Signed)
Pt called to r/s f/u appt d/t work conflict and states he would also like a second opinion regarding treatment of his prostate ca. Pt was advised to choose a provider and come to office to sign a medical record release form and we would forward his results to whomever he chooses. Pt verbalizes understanding.

## 2015-02-09 ENCOUNTER — Telehealth: Payer: Self-pay

## 2015-02-15 ENCOUNTER — Institutional Professional Consult (permissible substitution): Payer: Medicare Other | Admitting: Radiation Oncology

## 2015-03-02 ENCOUNTER — Ambulatory Visit: Payer: Medicare Other

## 2015-03-04 ENCOUNTER — Ambulatory Visit (INDEPENDENT_AMBULATORY_CARE_PROVIDER_SITE_OTHER): Payer: Medicare Other | Admitting: Urology

## 2015-03-04 ENCOUNTER — Encounter: Payer: Self-pay | Admitting: Urology

## 2015-03-04 VITALS — BP 148/84 | HR 63 | Ht 72.0 in | Wt 268.0 lb

## 2015-03-04 DIAGNOSIS — C61 Malignant neoplasm of prostate: Secondary | ICD-10-CM

## 2015-03-04 NOTE — Progress Notes (Signed)
03/04/2015 9:24 AM   Marc Macias July 11, 1945 [id number]  Referring provider: [name], MD [name] [name], [name] [address]  Chief Complaint  Patient presents with  . 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 He does have h/o MI/Stent but now only on ASA and no funcitonal limitations whatsoever. No abd or chest surgeries. No bothersome lower urinary tract symptoms. Moderate truncal obesity.   Interval History: The patient returns for follow-up of his prep prostate cancer. At his last visit he saw Dr. Tresa Moore. Dr. Jeannett Senior referred him to radiation oncology, the patient is not interested in having radiation this time. He is awaiting a second opinion at [facility] for his prostate cancer. He however would like to schedule a surgery now for early January.   PMH: Past Medical History  Diagnosis Date  . Family history of adverse reaction to anesthesia   . Coronary artery disease     stent 2008  . Myocardial infarction ([place])     2008  . Hypertension   . Arthritis   . HLD (hyperlipidemia)   . Benign essential HTN 10/16/2014    Last Assessment & Plan:  Blood pressure has been controlled without significant dizzyness or associated fatigue. Taking antihypertensives as directed without difficulty. Improved over time.    . Arteriosclerosis of coronary artery 01/29/2015    Overview:  Status post anteroapical myocardial infarction, PCI and stent placement LAD 02/2009   Last Assessment & Plan:  Status post anteroapical myocardial infarction, PCI and stent placement LAD 02/2009. Not having significant exertional chest pain. Taking medications for coronary disease without problems. Myalgia's are not present.    . Current tear knee, lateral meniscus 11/06/2014     Surgical History: Past Surgical History  Procedure Laterality Date  . Knee arthroscopy with meniscal repair Right 12/03/2014    Procedure: KNEE ARTHROSCOPY WITH MENISCAL REPAIR, Partial medial meniscectomy, Lateral meniscus repair with partial meniscectomy, Abrasion chondraplasty of Femoral tropia;  Surgeon: Corky Mull, MD;  Location: [facility] ORS;  Service: Orthopedics;  Laterality: Right;    Home Medications:    Medication List       This list is accurate as of: 03/04/15  9:24 AM.  Always use your most recent med list.               ADVIL PM PO  Take by mouth at bedtime.     aspirin 81 MG tablet  Take 81 mg by mouth daily.     atorvastatin 20 MG tablet  Commonly known as:  LIPITOR  Take 20 mg by mouth daily.     lisinopril-hydrochlorothiazide 20-12.5 MG tablet  Commonly known as:  PRINZIDE,ZESTORETIC  Take 1 tablet by mouth daily.        Allergies: No Known Allergies  Family History: Family History  Problem Relation Age of Onset  . Benign prostatic hyperplasia Father   . Kidney disease Neg Hx   . Prostate cancer Neg Hx   . Bladder Cancer Neg Hx     Social History:  reports that he has quit smoking. He does not have any smokeless tobacco history on file. He reports that he drinks alcohol. He reports that he does not use illicit drugs.  ROS:  UROLOGY Frequent Urination?: No Hard to postpone urination?: No Burning/pain with urination?: No Get up at night to urinate?: Yes Leakage of urine?: Yes Urine stream starts and stops?: No Trouble starting stream?: No Do you have to strain to urinate?: No Blood in urine?: No Urinary tract infection?: No Sexually transmitted disease?: No Injury to kidneys or bladder?: No Painful intercourse?: No Weak stream?: No Erection problems?: No Penile pain?: No  Gastrointestinal Nausea?: No Vomiting?: No Indigestion/heartburn?: No Diarrhea?: No Constipation?: No  Constitutional Fever: No Night sweats?: No Weight  loss?: No Fatigue?: No  Skin Skin rash/lesions?: No Itching?: No  Eyes Blurred vision?: No Double vision?: No  Ears/Nose/Throat Sore throat?: No Sinus problems?: No  Hematologic/Lymphatic Swollen glands?: No Easy bruising?: No  Cardiovascular Leg swelling?: No Chest pain?: No  Respiratory Cough?: No Shortness of breath?: No  Endocrine Excessive thirst?: No  Musculoskeletal Back pain?: No Joint pain?: No  Neurological Headaches?: No Dizziness?: No  Psychologic Depression?: No Anxiety?: No  Physical Exam: BP 148/84 mmHg  Pulse 63  Ht 6' (1.829 m)  Wt 268 lb (121.564 kg)  BMI 36.34 kg/m2  Constitutional:  Alert and oriented, No acute distress. HEENT: [place] AT, moist mucus membranes.  Trachea midline, no masses. Cardiovascular: No clubbing, cyanosis, or edema. Respiratory: Normal respiratory effort, no increased work of breathing. GI: Abdomen is soft, nontender, nondistended, no abdominal masses GU: No CVA tenderness.  Skin: No rashes, bruises or suspicious lesions. Lymph: No cervical or inguinal adenopathy. Neurologic: Grossly intact, no focal deficits, moving all 4 extremities. Psychiatric: Normal mood and affect.  Laboratory Data: Lab Results  Component Value Date   WBC 8.4 11/30/2014   HGB 13.8 11/30/2014   HCT 39.9* 11/30/2014   MCV 82.8 11/30/2014   PLT 200 11/30/2014    No results found for: CREATININE  Lab Results  Component Value Date   PSA 15.3* 12/24/2014    No results found for: TESTOSTERONE  No results found for: HGBA1C  Urinalysis No results found for: COLORURINE, APPEARANCEUR, LABSPEC, PHURINE, GLUCOSEU, HGBUR, BILIRUBINUR, KETONESUR, PROTEINUR, UROBILINOGEN, NITRITE, LEUKOCYTESUR  Assessment & Plan:    1. Intermediate risk prostate cancer I discussed with the patient the risks, benefits, indications, and alternatives for robotic prostatectomy. He understands that his prostate cancer is intermediate risk. He is a Gleason  4+3 = 7 with a PSA of 15.3, so he does not need further imaging. He is aware that robotic prostatectomy has risks that include but are not limited to incontinence and impotence. He is aware that he will have a Foley catheter postoperatively. He is aware that he will likely need to wear pads at least in the immediate perioperative period and potentially remainder of his life. He would like to receive a second opinion from due to this time. He will also tentatively schedule his surgery in January due to his work schedule. We will do this at this time. We'll see him back in December 2016 to further discuss surgery and answer any questions that come up in the interim.     Return in about 7 weeks (around 04/21/2015) for with Dr. Pilar Jarvis.  Needs 30 minute time slot prior to lunch or at end of the day.  Nickie Retort, MD  [facility] [address] [place], [place] [address] [phone]

## 2015-04-22 ENCOUNTER — Ambulatory Visit (INDEPENDENT_AMBULATORY_CARE_PROVIDER_SITE_OTHER): Payer: Medicare Other | Admitting: Urology

## 2015-04-22 VITALS — Wt 268.1 lb

## 2015-04-22 DIAGNOSIS — C61 Malignant neoplasm of prostate: Secondary | ICD-10-CM

## 2015-04-22 LAB — MICROSCOPIC EXAMINATION
Bacteria, UA: NONE SEEN
EPITHELIAL CELLS (NON RENAL): NONE SEEN /HPF (ref 0–10)
RBC, UA: NONE SEEN /hpf (ref 0–?)
WBC UA: NONE SEEN /HPF (ref 0–?)

## 2015-04-22 LAB — URINALYSIS, COMPLETE
BILIRUBIN UA: NEGATIVE
Glucose, UA: NEGATIVE
Ketones, UA: NEGATIVE
Leukocytes, UA: NEGATIVE
NITRITE UA: NEGATIVE
Protein, UA: NEGATIVE
RBC UA: NEGATIVE
Specific Gravity, UA: 1.025 (ref 1.005–1.030)
UUROB: 0.2 mg/dL (ref 0.2–1.0)
pH, UA: 6 (ref 5.0–7.5)

## 2015-04-22 NOTE — Progress Notes (Signed)
Pt comes in the office today stating he is being followed by Dr.Kerry Alford Highland at [facility] for a 2nd Opinion. He said that he's still not ready to schedule surgery, because he's currently scheduled for a Bone Scan & CT scan at the begin of January. After that Dr. Alford Highland will discuss his recommendation for Cancer treatment. The pt also states he doesn't understand the reason for this appt today., because he still have not made a decision. Appt was cancelled per Dr.McKenzie and rescheduled for 06/10/15, because by then the patient should have made a decision. I also informed the pt that the appt as scheduled,because we wanted to make sure someone is addressing the newly diagnosis of prostate cancer.

## 2015-06-07 DIAGNOSIS — C61 Malignant neoplasm of prostate: Secondary | ICD-10-CM | POA: Insufficient documentation

## 2015-06-10 ENCOUNTER — Telehealth: Payer: Self-pay | Admitting: Radiology

## 2015-06-10 ENCOUNTER — Ambulatory Visit: Payer: Medicare Other | Admitting: Urology

## 2015-06-10 NOTE — Telephone Encounter (Signed)
Pt wants to cancel appt with Dr Pilar Jarvis regarding possible prostatectomy. He will be going to [facility] for this surgery.

## 2015-06-22 ENCOUNTER — Encounter: Payer: Self-pay | Admitting: Urology

## 2015-06-28 ENCOUNTER — Telehealth: Payer: Self-pay | Admitting: Urology

## 2015-06-28 NOTE — Telephone Encounter (Signed)
Requested records froom [facility] on 02-12-15 @ [phone] but never received any.  Sharyn Lull

## 2015-11-26 NOTE — Telephone Encounter (Signed)
x

## 2016-07-07 IMAGING — MR MR KNEE*R* W/O CM
6 series · 40 of 40 positions shown · non-contrast
Comparison: None.

CLINICAL DATA: Chronic knee pain since a remote football injury.
The patient sustained a sliding injury to the right knee August 2014
with worsened pain. Initial encounter.

EXAM:
MRI OF THE RIGHT KNEE WITHOUT CONTRAST
TECHNIQUE: Multiplanar, multisequence MR imaging of the knee was performed. No
intravenous contrast was administered.

[Series 3: PD fat-sat · axial · 3.0mm · 0.29mm/px · z∈[-60,+39]mm · 6 of 31 slices shown (1 of 3)]
[im 1/31]
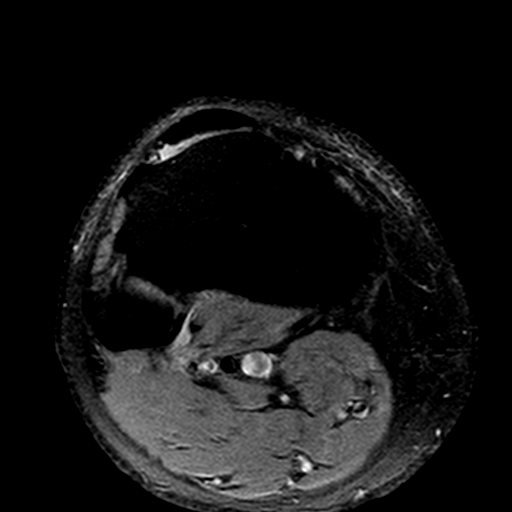
[im 7/31]
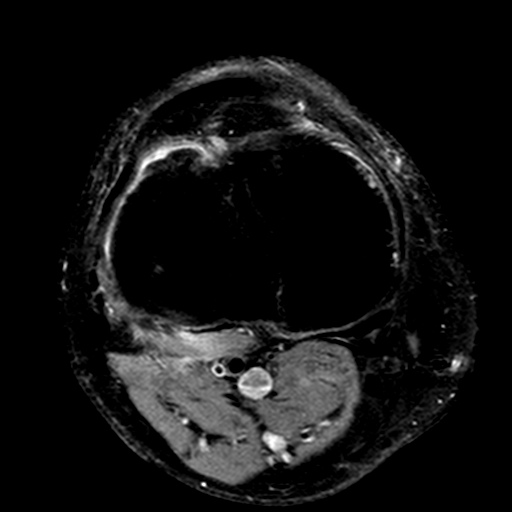
[im 13/31]
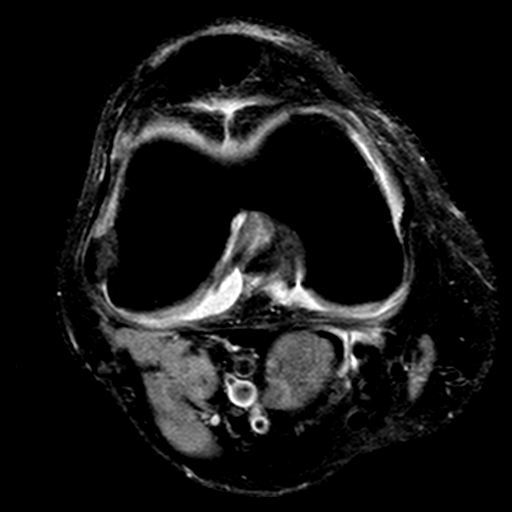
[im 19/31]
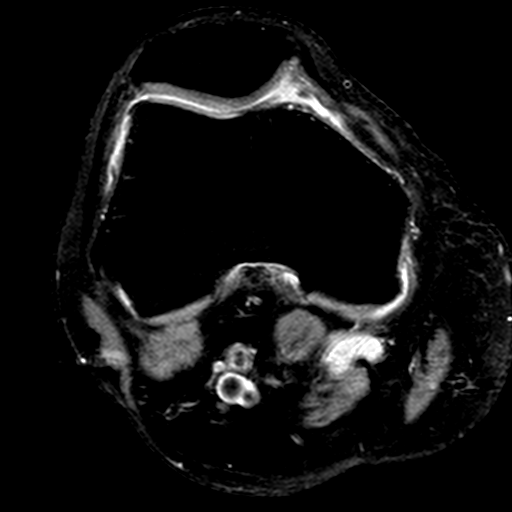
[im 25/31]
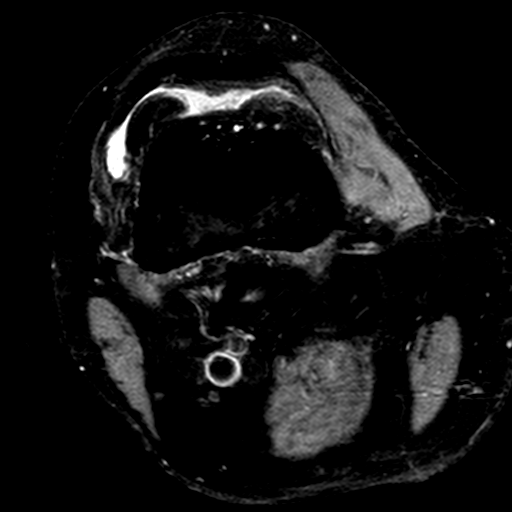
[im 31/31]
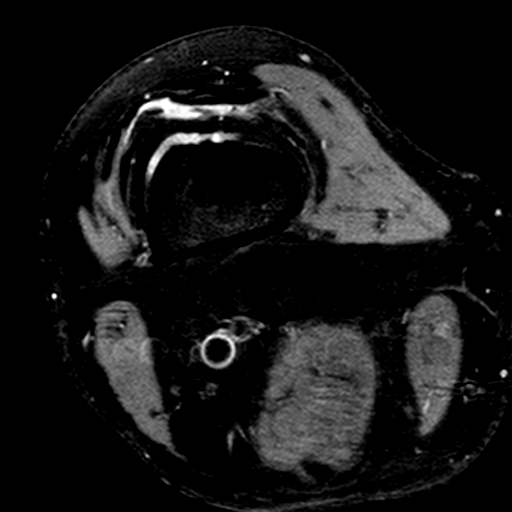

[Series 4: T2 fat-sat · coronal · 3.0mm · 0.62mm/px · 8 of 33 slices shown]
[im 1/33]
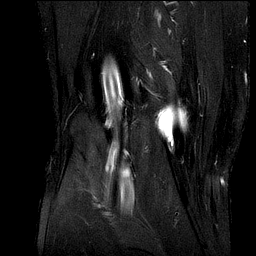
[im 5/33]
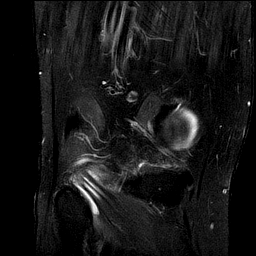
[im 10/33]
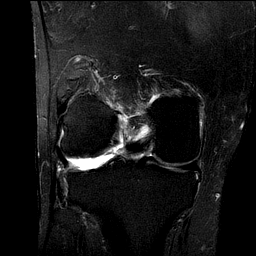
[im 14/33]
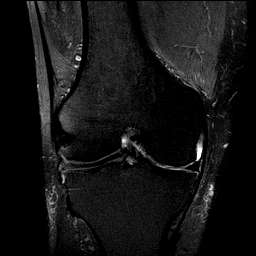
[im 19/33]
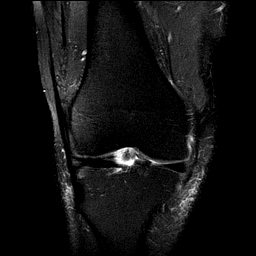
[im 23/33]
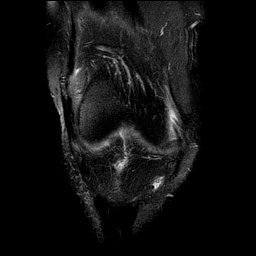
[im 28/33]
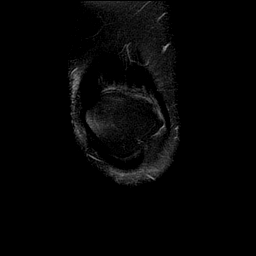
[im 33/33]
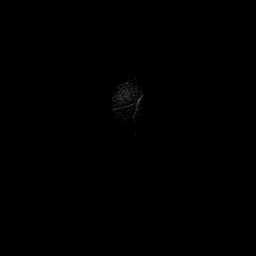

[Series 5: PD fat-sat · sagittal · 3.0mm · 0.62mm/px · 8 of 32 slices shown (2 of 3)]
[im 1/32]
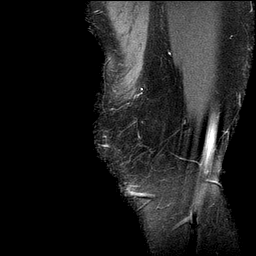
[im 5/32]
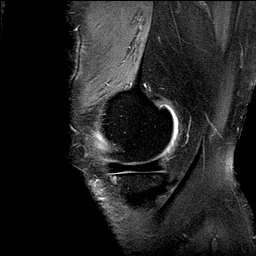
[im 9/32]
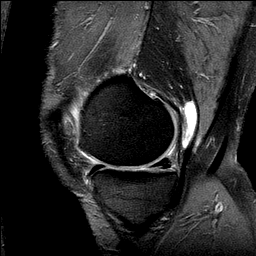
[im 14/32]
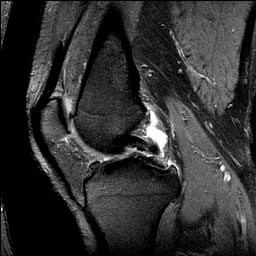
[im 18/32]
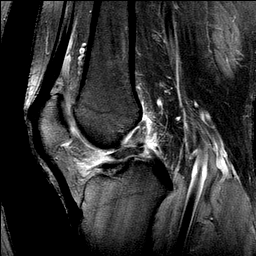
[im 23/32]
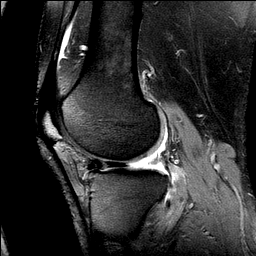
[im 27/32]
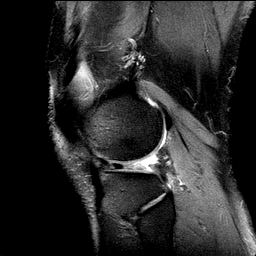
[im 32/32]
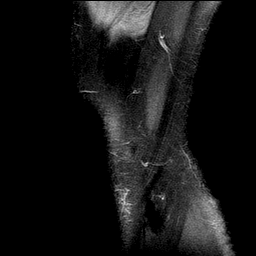

[Series 6: PD fat-sat · coronal · 3.0mm · 0.62mm/px · 8 of 33 slices shown (3 of 3)]
[im 1/33]
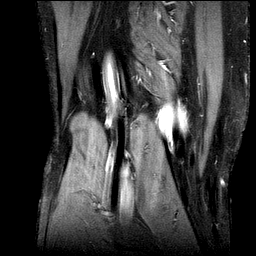
[im 5/33]
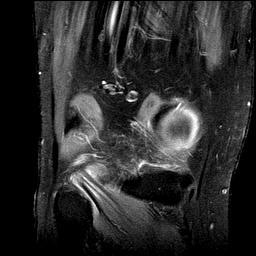
[im 10/33]
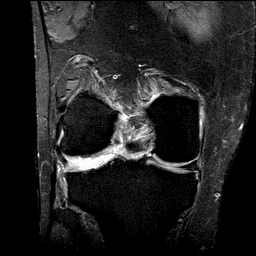
[im 14/33]
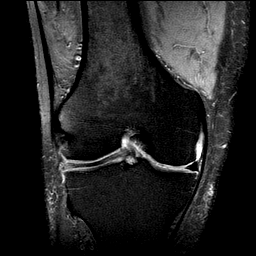
[im 19/33]
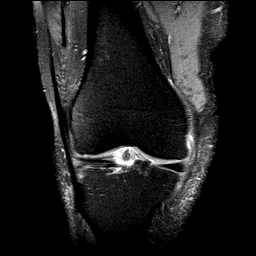
[im 23/33]
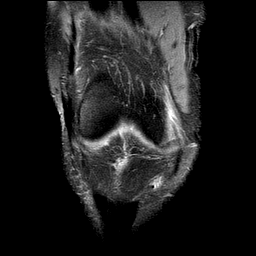
[im 28/33]
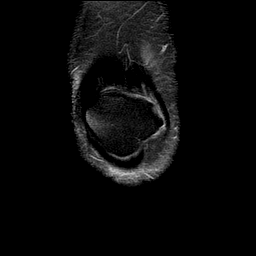
[im 33/33]
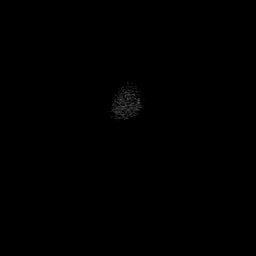

[Series 7: T1 · coronal · 3.0mm · 0.62mm/px · 8 of 33 slices shown]
[im 1/33]
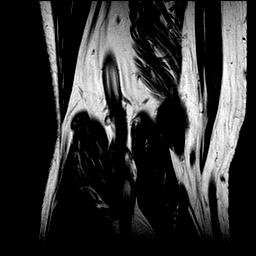
[im 5/33]
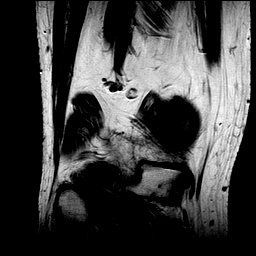
[im 10/33]
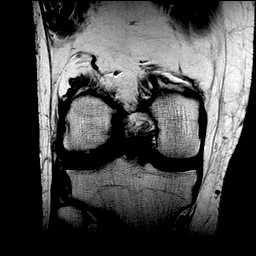
[im 14/33]
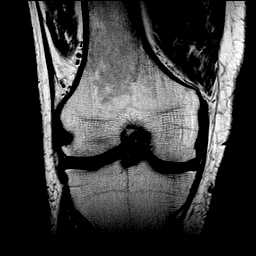
[im 19/33]
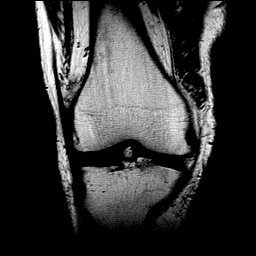
[im 23/33]
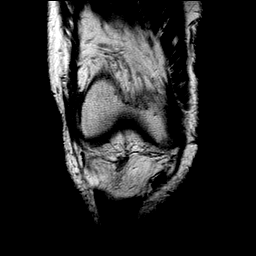
[im 28/33]
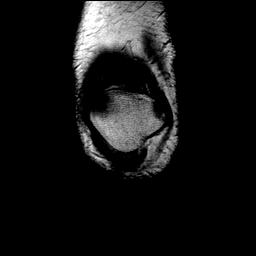
[im 33/33]
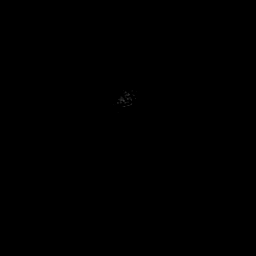

[Series 8: PD · coronal · 3.0mm · 0.31mm/px · 2 of 8 slices shown]
[im 1/8]
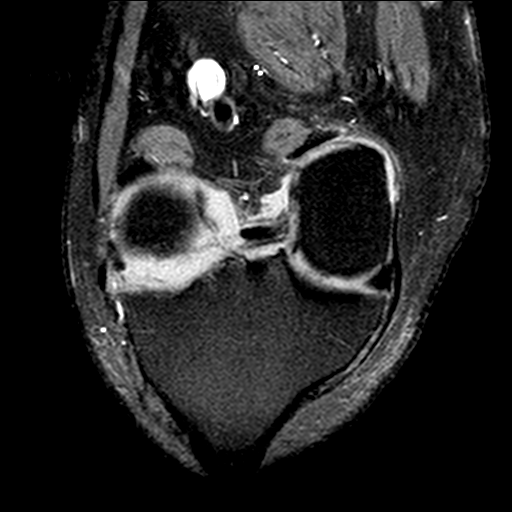
[im 8/8]
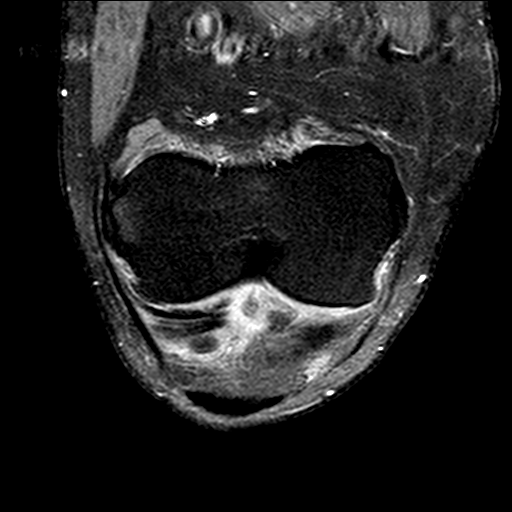

[40 of 40 positions shown; findings below may reference images not displayed]

FINDINGS: MENISCI

Medial meniscus: There is an oblique tear in the posterior horn of
the medial meniscus reaching the meniscal undersurface. No displaced
fragment.

Lateral meniscus: The patient has a bucket-handle tear of the
lateral meniscus with the posterior horn and body flipped centrally
and anteriorly.

LIGAMENTS

Cruciates: Intact. There is some mucoid degeneration anterior
cruciate ligament.

Collaterals:  Intact.

CARTILAGE

Patellofemoral: Small fissures are seen in hyaline cartilage
centrally in the femoral trochlea. No underlying reactive marrow
signal change.

Medial:  Hyaline cartilage is mildly degenerated.

Lateral: There are near full-thickness hyaline cartilage defects
about the posterior aspect of the lateral compartment.

Joint:  Small joint effusion is seen.

Popliteal Fossa: Small Baker's cyst measures 3 cm craniocaudal by
2.1 cm transverse by 0.7 cm AP.

Extensor Mechanism:  Intact.

Bones: Osteophytosis about the knee is most prominent laterally. No
fracture or worrisome marrow lesion is identified.
IMPRESSION: Large bucket-handle tear of the lateral meniscus with the posterior
horn and body flipped centrally and anteriorly.

Oblique tear posterior horn medial meniscus reaches the meniscal
undersurface.

Degenerative disease about the knee appears worst the lateral
compartment.

Small Baker cyst.

## 2016-10-26 DIAGNOSIS — E118 Type 2 diabetes mellitus with unspecified complications: Secondary | ICD-10-CM | POA: Insufficient documentation

## 2017-11-08 DIAGNOSIS — I252 Old myocardial infarction: Secondary | ICD-10-CM | POA: Insufficient documentation

## 2020-11-30 ENCOUNTER — Encounter: Payer: Self-pay | Admitting: Internal Medicine

## 2020-12-01 ENCOUNTER — Ambulatory Visit: Payer: Medicare Other | Admitting: Certified Registered"

## 2020-12-01 ENCOUNTER — Encounter: Payer: Self-pay | Admitting: Internal Medicine

## 2020-12-01 ENCOUNTER — Other Ambulatory Visit: Payer: Self-pay

## 2020-12-01 ENCOUNTER — Encounter
Admission: RE | Disposition: A | Discharge status: Home / Self Care | Payer: Self-pay | Source: Home / Self Care | Attending: Internal Medicine

## 2020-12-01 ENCOUNTER — Ambulatory Visit
Admission: RE | Admit: 2020-12-01 | Discharge: 2020-12-01 | Disposition: A | Discharge status: Home / Self Care | Payer: Medicare Other | Attending: Internal Medicine | Admitting: Internal Medicine

## 2020-12-01 DIAGNOSIS — Z7982 Long term (current) use of aspirin: Secondary | ICD-10-CM | POA: Insufficient documentation

## 2020-12-01 DIAGNOSIS — Z1211 Encounter for screening for malignant neoplasm of colon: Secondary | ICD-10-CM | POA: Insufficient documentation

## 2020-12-01 DIAGNOSIS — Z87891 Personal history of nicotine dependence: Secondary | ICD-10-CM | POA: Diagnosis not present

## 2020-12-01 DIAGNOSIS — Z955 Presence of coronary angioplasty implant and graft: Secondary | ICD-10-CM | POA: Diagnosis not present

## 2020-12-01 DIAGNOSIS — Z79899 Other long term (current) drug therapy: Secondary | ICD-10-CM | POA: Insufficient documentation

## 2020-12-01 DIAGNOSIS — Z8546 Personal history of malignant neoplasm of prostate: Secondary | ICD-10-CM | POA: Diagnosis not present

## 2020-12-01 DIAGNOSIS — D123 Benign neoplasm of transverse colon: Secondary | ICD-10-CM | POA: Diagnosis not present

## 2020-12-01 DIAGNOSIS — K640 First degree hemorrhoids: Secondary | ICD-10-CM | POA: Insufficient documentation

## 2020-12-01 DIAGNOSIS — D125 Benign neoplasm of sigmoid colon: Secondary | ICD-10-CM | POA: Insufficient documentation

## 2020-12-01 DIAGNOSIS — K5730 Diverticulosis of large intestine without perforation or abscess without bleeding: Secondary | ICD-10-CM | POA: Diagnosis not present

## 2020-12-01 HISTORY — DX: Malignant (primary) neoplasm, unspecified: C80.1

## 2020-12-01 HISTORY — PX: COLONOSCOPY WITH PROPOFOL: SHX5780

## 2020-12-01 HISTORY — DX: Malignant neoplasm of prostate: C61

## 2020-12-01 HISTORY — DX: Other specified disorders of kidney and ureter: N28.89

## 2020-12-01 SURGERY — COLONOSCOPY WITH PROPOFOL
Anesthesia: General

## 2020-12-01 MED ORDER — PROPOFOL 10 MG/ML IV BOLUS
INTRAVENOUS | Status: DC | PRN
  Administered 2020-12-01: 60 mg via INTRAVENOUS

## 2020-12-01 MED ORDER — EPHEDRINE SULFATE 50 MG/ML IJ SOLN
INTRAMUSCULAR | Status: DC | PRN
  Administered 2020-12-01 (×2): 5 mg via INTRAVENOUS

## 2020-12-01 MED ORDER — SODIUM CHLORIDE 0.9 % IV SOLN: INTRAVENOUS | Status: DC

## 2020-12-01 MED ORDER — PROPOFOL 10 MG/ML IV BOLUS
INTRAVENOUS | Status: AC
  Filled 2020-12-01: qty 20

## 2020-12-01 MED ORDER — PROPOFOL 500 MG/50ML IV EMUL
INTRAVENOUS | Status: DC | PRN
  Administered 2020-12-01: 150 ug/kg/min via INTRAVENOUS

## 2020-12-01 MED ORDER — LIDOCAINE HCL (CARDIAC) PF 100 MG/5ML IV SOSY
PREFILLED_SYRINGE | INTRAVENOUS | Status: DC | PRN
  Administered 2020-12-01: 50 mg via INTRAVENOUS

## 2020-12-01 NOTE — Op Note (Signed)
[facility] Patient Name: Marc Macias Procedure Date: 12/01/2020 1:30 PM MRN: [id number] Account #: [id number] Date of Birth: 13-Jan-1946 Admit Type: Outpatient Age: 75 Room: [facility] ENDO ROOM 2 Gender: Male Note Status: Finalized Procedure:             Colonoscopy Indications:           Screening for colorectal malignant neoplasm Providers:             Benay Pike. [name] MD, MD Referring MD:          Ocie Cornfield. Ouida Sills MD, MD (Referring MD) Medicines:             Propofol per Anesthesia Complications:         No immediate complications. Procedure:             Pre-Anesthesia Assessment:                        - The risks and benefits of the procedure and the                         sedation options and risks were discussed with the                         patient. All questions were answered and informed                         consent was obtained.                        - Patient identification and proposed procedure were                         verified prior to the procedure by the nurse. The                         procedure was verified in the procedure room.                        - ASA Grade Assessment: III - A patient with severe                         systemic disease.                        - After reviewing the risks and benefits, the patient                         was deemed in satisfactory condition to undergo the                         procedure.                        After obtaining informed consent, the colonoscope was                         passed under direct vision. Throughout the procedure,                         the patient's blood  pressure, pulse, and oxygen                         saturations were monitored continuously. The                         Colonoscope was introduced through the anus and                         advanced to the the cecum, identified by appendiceal                         orifice and ileocecal  valve. The colonoscopy was                         performed without difficulty. The patient tolerated                         the procedure well. The quality of the bowel                         preparation was good. The ileocecal valve, appendiceal                         orifice, and rectum were photographed. Findings:      The perianal and digital rectal examinations were normal. Pertinent       negatives include normal sphincter tone and no palpable rectal lesions.      Non-bleeding internal hemorrhoids were found during retroflexion. The       hemorrhoids were Grade I (internal hemorrhoids that do not prolapse).      Many small and large-mouthed diverticula were found in the sigmoid colon.      Two sessile polyps were found in the sigmoid colon and transverse colon.       The polyps were 4 to 5 mm in size. These polyps were removed with a       jumbo cold forceps. Resection and retrieval were complete.      The exam was otherwise without abnormality. Impression:            - Non-bleeding internal hemorrhoids.                        - Diverticulosis in the sigmoid colon.                        - Two 4 to 5 mm polyps in the sigmoid colon and in the                         transverse colon, removed with a jumbo cold forceps.                         Resected and retrieved.                        - The examination was otherwise normal. Recommendation:        - Patient has a contact number available for                         emergencies.  The signs and symptoms of potential                         delayed complications were discussed with the patient.                         Return to normal activities tomorrow. Written                         discharge instructions were provided to the patient.                        - Resume previous diet.                        - Continue present medications.                        - Repeat colonoscopy is recommended for surveillance.                          The colonoscopy date will be determined after                         pathology results from today's exam become available                         for review.                        - Return to GI office PRN.                        - The findings and recommendations were discussed with                         the patient. Procedure Code(s):     --- Professional ---                        [id number], Colonoscopy, flexible; with biopsy, single or                         multiple Diagnosis Code(s):     --- Professional ---                        K57.30, Diverticulosis of large intestine without                         perforation or abscess without bleeding                        K63.5, Polyp of colon                        K64.0, First degree hemorrhoids                        Z12.11, Encounter for screening for malignant neoplasm                         of colon CPT copyright  2019 American Medical Association. All rights reserved. The codes documented in this report are preliminary and upon coder review may  be revised to meet current compliance requirements. Efrain Sella MD, MD 12/01/2020 2:01:06 PM This report has been signed electronically. Number of Addenda: 0 Note Initiated On: 12/01/2020 1:30 PM Scope Withdrawal Time: 0 hours 8 minutes 56 seconds  Total Procedure Duration: 0 hours 14 minutes 56 seconds  Estimated Blood Loss:  Estimated blood loss: none.      [facility]

## 2020-12-01 NOTE — Transfer of Care (Signed)
Immediate Anesthesia Transfer of Care Note  Patient: Marc Macias  Procedure(s) Performed: COLONOSCOPY WITH PROPOFOL  Patient Location: PACU  Anesthesia Type:General  Level of Consciousness: drowsy  Airway & Oxygen Therapy: Patient Spontanous Breathing  Post-op Assessment: Report given to RN and Post -op Vital signs reviewed and stable  Post vital signs: Reviewed and stable  Last Vitals:  Vitals Value Taken Time  BP 102/42 12/01/20 1359  Temp 36.3 C 12/01/20 1359  Pulse 61 12/01/20 1400  Resp 21 12/01/20 1400  SpO2 95 % 12/01/20 1400  Vitals shown include unvalidated device data.  Last Pain:  Vitals:   12/01/20 1359  TempSrc: Temporal  PainSc: Asleep         Complications: No notable events documented.

## 2020-12-01 NOTE — Anesthesia Postprocedure Evaluation (Signed)
Anesthesia Post Note  Patient: Marc Macias  Procedure(s) Performed: COLONOSCOPY WITH PROPOFOL  Patient location during evaluation: Phase II Anesthesia Type: General Level of consciousness: awake and alert, awake and oriented Pain management: pain level controlled Vital Signs Assessment: post-procedure vital signs reviewed and stable Respiratory status: spontaneous breathing, nonlabored ventilation and respiratory function stable Cardiovascular status: blood pressure returned to baseline and stable Postop Assessment: no apparent nausea or vomiting Anesthetic complications: no   No notable events documented.   Last Vitals:  Vitals:   12/01/20 1227 12/01/20 1359  BP: 132/80 (!) 102/42  Pulse: 67   Resp: 18   Temp: (!) 36.1 C (!) 36.3 C  SpO2: 96%     Last Pain:  Vitals:   12/01/20 1429  TempSrc:   PainSc: 0-No pain                 Phill Mutter

## 2020-12-01 NOTE — Anesthesia Preprocedure Evaluation (Signed)
Anesthesia Evaluation  Patient identified by MRN, date of birth, ID band Patient awake    Reviewed: Allergy & Precautions, NPO status , Patient's Chart, lab work & pertinent test results  History of Anesthesia Complications (+) PONV, Family history of anesthesia reaction and history of anesthetic complications  Airway Mallampati: III  TM Distance: <3 FB Neck ROM: full    Dental  (+) Chipped, Poor Dentition, Missing   Pulmonary neg shortness of breath, former smoker,    Pulmonary exam normal        Cardiovascular Exercise Tolerance: Good hypertension, (-) angina+ CAD, + Past MI and + Cardiac Stents  Normal cardiovascular exam     Neuro/Psych negative neurological ROS  negative psych ROS   GI/Hepatic negative GI ROS, Neg liver ROS, neg GERD  ,  Endo/Other  negative endocrine ROS  Renal/GU negative Renal ROS  negative genitourinary   Musculoskeletal  (+) Arthritis ,   Abdominal   Peds  Hematology negative hematology ROS (+)   Anesthesia Other Findings Past Medical History: 01/29/2015: Arteriosclerosis of coronary artery     Comment:  Overview:  Status post anteroapical myocardial               infarction, PCI and stent placement LAD 02/2009   Last               Assessment & Plan:  Status post anteroapical myocardial               infarction, PCI and stent placement LAD 02/2009. Not               having significant exertional chest pain. Taking               medications for coronary disease without problems.               Myalgia's are not present.   No date: Arthritis 10/16/2014: Benign essential HTN     Comment:  Last Assessment & Plan:  Blood pressure has been               controlled without significant dizzyness or associated               fatigue. Taking antihypertensives as directed without               difficulty. Improved over time.   No date: Cancer [facility]) No date: Coronary artery disease      Comment:  stent 2008 11/06/2014: Current tear knee, lateral meniscus No date: Family history of adverse reaction to anesthesia No date: HLD (hyperlipidemia) No date: Hypertension No date: Myocardial infarction [facility])     Comment:  2008 No date: Prostate cancer ([place]) No date: Renal mass, left  Past Surgical History: No date: CARDIAC CATHETERIZATION No date: CORONARY ANGIOPLASTY No date: DIAGNOSTIC LAPAROSCOPY 12/03/2014: KNEE ARTHROSCOPY WITH MENISCAL REPAIR; Right     Comment:  Procedure: KNEE ARTHROSCOPY WITH MENISCAL REPAIR,               Partial medial meniscectomy, Lateral meniscus repair with              partial meniscectomy, Abrasion chondraplasty of Femoral               tropia;  Surgeon: Corky Mull, MD;  Location: [facility] ORS;               Service: Orthopedics;  Laterality: Right; No date: ROBOT ASSISTED LAPAROSCOPIC RADICAL PROSTATECTOMY  BMI    Body Mass  Index: 36.48 kg/m      Reproductive/Obstetrics negative OB ROS                            Anesthesia Physical Anesthesia Plan  ASA: 3  Anesthesia Plan: General   Post-op Pain Management:    Induction: Intravenous  PONV Risk Score and Plan: Propofol infusion and TIVA  Airway Management Planned: Natural Airway and Nasal Cannula  Additional Equipment:   Intra-op Plan:   Post-operative Plan:   Informed Consent: I have reviewed the patients History and Physical, chart, labs and discussed the procedure including the risks, benefits and alternatives for the proposed anesthesia with the patient or authorized representative who has indicated his/her understanding and acceptance.     Dental Advisory Given  Plan Discussed with: Anesthesiologist, CRNA and Surgeon  Anesthesia Plan Comments: (Patient consented for risks of anesthesia including but not limited to:  - adverse reactions to medications - risk of airway placement if required - damage to eyes, teeth, lips or other oral  mucosa - nerve damage due to positioning  - sore throat or hoarseness - Damage to heart, brain, nerves, lungs, other parts of body or loss of life  Patient voiced understanding.)        Anesthesia Quick Evaluation

## 2020-12-01 NOTE — Interval H&P Note (Signed)
History and Physical Interval Note:  12/01/2020 1:36 PM  Marc Macias  has presented today for surgery, with the diagnosis of CCA.  The various methods of treatment have been discussed with the patient and family. After consideration of risks, benefits and other options for treatment, the patient has consented to  Procedure(s): COLONOSCOPY WITH PROPOFOL (N/A) as a surgical intervention.  The patient's history has been reviewed, patient examined, no change in status, stable for surgery.  I have reviewed the patient's chart and labs.  Questions were answered to the patient's satisfaction.     [place], [place]

## 2020-12-01 NOTE — H&P (Signed)
  Outpatient short stay form Pre-procedure 12/01/2020 1:35 PM Marc Macias K. Marc Macias, M.D.  Primary Physician: Arna Snipe, M.D.  Reason for visit:  Colon cancer screening  History of present illness:  Patient presents for colonoscopy for colon cancer screening. The patient denies complaints of abdominal pain, significant change in bowel habits, or rectal bleeding.      Current Facility-Administered Medications:    0.9 %  sodium chloride infusion, , Intravenous, Continuous, [place], Benay Pike, MD, Last Rate: 20 mL/hr at 12/01/20 1334, Continued from Pre-op at 12/01/20 1334  Medications Prior to Admission  Medication Sig Dispense Refill Last Dose   aspirin 81 MG tablet Take 81 mg by mouth daily.   11/30/2020   atorvastatin (LIPITOR) 20 MG tablet Take 20 mg by mouth daily.   11/30/2020   lisinopril-hydrochlorothiazide (PRINZIDE,ZESTORETIC) 20-12.5 MG per tablet Take 1 tablet by mouth daily.   12/01/2020 at 0700   Ibuprofen-Diphenhydramine Cit (ADVIL PM PO) Take by mouth at bedtime.        No Known Allergies   Past Medical History:  Diagnosis Date   Arteriosclerosis of coronary artery 01/29/2015   Overview:  Status post anteroapical myocardial infarction, PCI and stent placement LAD 02/2009   Last Assessment & Plan:  Status post anteroapical myocardial infarction, PCI and stent placement LAD 02/2009. Not having significant exertional chest pain. Taking medications for coronary disease without problems. Myalgia's are not present.     Arthritis    Benign essential HTN 10/16/2014   Last Assessment & Plan:  Blood pressure has been controlled without significant dizzyness or associated fatigue. Taking antihypertensives as directed without difficulty. Improved over time.     Cancer [facility])    Coronary artery disease    stent 2008   Current tear knee, lateral meniscus 11/06/2014   Family history of adverse reaction to anesthesia    HLD (hyperlipidemia)    Hypertension    Myocardial  infarction [facility])    2008   Prostate cancer [facility])    Renal mass, left     Review of systems:  Otherwise negative.    Physical Exam  Gen: Alert, oriented. Appears stated age.  HEENT: [place]/AT. PERRLA. Lungs: CTA, no wheezes. CV: RR nl S1, S2. Abd: soft, benign, no masses. BS+ Ext: No edema. Pulses 2+    Planned procedures: Proceed with colonoscopy. The patient understands the nature of the planned procedure, indications, risks, alternatives and potential complications including but not limited to bleeding, infection, perforation, damage to internal organs and possible oversedation/side effects from anesthesia. The patient agrees and gives consent to proceed.  Please refer to procedure notes for findings, recommendations and patient disposition/instructions.     Marc Macias K. Marc Macias, M.D. Gastroenterology 12/01/2020  1:35 PM

## 2020-12-02 ENCOUNTER — Encounter: Payer: Self-pay | Admitting: Internal Medicine

## 2020-12-03 LAB — SURGICAL PATHOLOGY

## 2021-08-29 NOTE — Progress Notes (Signed)
? ?08/31/2021 ?11:23 AM  ? ?Marc Macias ?[birth year] ?[id number] ? ?Referring provider:  ?[name], MD ?[place] [address] ?Chief Complaint  ?Patient presents with  ? Prostate Cancer  ?  New Patient  ? ? ?HPI: ?Marc Macias is a 76 y.o.male with a personal history of prostate cancer who presents today for further evaluation of prostate cancer.  ? ?He underwent a prostate biopsy surgical pathology showed mix of Gleason 4+3, 3+4, 3+3 on all left sided cores (6/12) by BX 12/2014 on eval PSA 14.59. ? ?He is s/p laparoscopic prostatectomy robotic on 07/22/2015 by Dr Rolene Arbour. pT2 N0 MX negative margins no SV involvement.  ? ?Post-op PSA was 0.91.  ? ?He received savage IMRT with a short course of ADT, presumably 6 mo per patient. ? ?He was seen by his PCP, Dr Ouida Sills, on 08/11/2021, his PSA was 1.38 on 08/04/2021. He was referred back to urology.   ? ?He does have f/u with [facility] scheduled for in a few weeks.  No symptoms other than for chronic urinary leakage.   ? ?PSA trend:  ?12/2014   15.3 ?04/2017  0.02 ?10/2017  0.05 ?04/2018  0.10  ?06/2019  0.37 ?07/2020 0.95 ?08/04/2021 1.38  ? ? ?PMH: ?Past Medical History:  ?Diagnosis Date  ? Arteriosclerosis of coronary artery 01/29/2015  ? Overview:  Status post anteroapical myocardial infarction, PCI and stent placement LAD 02/2009   Last Assessment & Plan:  Status post anteroapical myocardial infarction, PCI and stent placement LAD 02/2009. Not having significant exertional chest pain. Taking medications for coronary disease without problems. Myalgia's are not present.    ? Arthritis   ? Benign essential HTN 10/16/2014  ? Last Assessment & Plan:  Blood pressure has been controlled without significant dizzyness or associated fatigue. Taking antihypertensives as directed without difficulty. Improved over time.    ? Coronary artery disease   ? stent 2008  ? Current tear knee, lateral meniscus 11/06/2014  ? Family  history of adverse reaction to anesthesia   ? HLD (hyperlipidemia)   ? Hypertension   ? Myocardial infarction [facility])   ? 2008  ? Prostate cancer ([place])   ? Renal mass, left   ? ? ?Surgical History: ?Past Surgical History:  ?Procedure Laterality Date  ? CARDIAC CATHETERIZATION    ? COLONOSCOPY WITH PROPOFOL N/A 12/01/2020  ? Procedure: COLONOSCOPY WITH PROPOFOL;  Surgeon: [place], Benay Pike, MD;  Location: [facility] ENDOSCOPY;  Service: Gastroenterology;  Laterality: N/A;  ? CORONARY ANGIOPLASTY    ? DIAGNOSTIC LAPAROSCOPY    ? KNEE ARTHROSCOPY WITH MENISCAL REPAIR Right 12/03/2014  ? Procedure: KNEE ARTHROSCOPY WITH MENISCAL REPAIR, Partial medial meniscectomy, Lateral meniscus repair with partial meniscectomy, Abrasion chondraplasty of Femoral tropia;  Surgeon: Corky Mull, MD;  Location: [facility] ORS;  Service: Orthopedics;  Laterality: Right;  ? ROBOT ASSISTED LAPAROSCOPIC RADICAL PROSTATECTOMY    ? ? ?Home Medications:  ?Allergies as of 08/31/2021   ?No Known Allergies ?  ? ?  ?Medication List  ?  ? ?  ? Accurate as of August 31, 2021 11:23 AM. If you have any questions, ask your nurse or doctor.  ?  ?  ? ?  ? ?ADVIL PM PO ?Take by mouth at bedtime. ?  ?aspirin 81 MG tablet ?Take 81 mg by mouth daily. ?  ?atorvastatin 40 MG tablet ?Commonly known as: LIPITOR ?Take by mouth. ?What changed: Another medication with the same name was  removed. Continue taking this medication, and follow the directions you see here. ?Changed by: Hollice Espy, MD ?  ?lisinopril-hydrochlorothiazide 20-12.5 MG tablet ?Commonly known as: ZESTORETIC ?Take 1 tablet by mouth daily. ?  ? ?  ? ? ?Allergies:  ?No Known Allergies ? ?Family History: ?Family History  ?Problem Relation Age of Onset  ? Benign prostatic hyperplasia Father   ? Kidney disease Neg Hx   ? Prostate cancer Neg Hx   ? Bladder Cancer Neg Hx   ? Kidney cancer Neg Hx   ? ? ?Social History:  reports that he quit smoking about 13 years ago. His smoking use included cigarettes. He has a  40.00 pack-year smoking history. He does not have any smokeless tobacco history on file. He reports that he does not currently use alcohol. He reports that he does not use drugs. ? ? ?Physical Exam: ?BP (!) 144/75   Pulse 93   Ht 6' (1.829 m)   Wt 272 lb (123.4 kg)   BMI 36.89 kg/m?   ?Constitutional:  Alert and oriented, No acute distress. ?HEENT: [place] AT, moist mucus membranes.  Trachea midline, no masses. ?Cardiovascular: No clubbing, cyanosis, or edema. ?Respiratory: Normal respiratory effort, no increased work of breathing. ?Skin: No rashes, bruises or suspicious lesions. ?Neurologic: Grossly intact, no focal deficits, moving all 4 extremities. ?Psychiatric: Normal mood and affect. ? ? ?Assessment & Plan:   ?Prostate cancer  ?- In light of steady rise in PSA will further evaluate with PSMA PET scan c/w biochemical recurrence, metastatic status unknown  ?- If PSMA PET scan is concerning discussed treatment options that we can proceed with. He understands this and is agreeable with plan. ?- PSMA PET scan; scheduled  ?-prefers to f/u with [facility] as he is already established ? ?Return for keep follow up with [facility]. ? ?I,Kailey Littlejohn,acting as a scribe for Hollice Espy, MD.,have documented all relevant documentation on the behalf of Hollice Espy, MD,as directed by  Hollice Espy, MD while in the presence of Hollice Espy, MD. ? ?I have reviewed the above documentation for accuracy and completeness, and I agree with the above.  ? ?Hollice Espy, MD ? ? ?[place] ?[address] ?[place], [place] [address] ?[phone] ? ?

## 2021-08-31 ENCOUNTER — Ambulatory Visit (INDEPENDENT_AMBULATORY_CARE_PROVIDER_SITE_OTHER): Payer: Medicare Other | Admitting: Urology

## 2021-08-31 ENCOUNTER — Encounter: Payer: Self-pay | Admitting: Urology

## 2021-08-31 VITALS — BP 144/75 | HR 93 | Ht 72.0 in | Wt 272.0 lb

## 2021-08-31 DIAGNOSIS — Z8546 Personal history of malignant neoplasm of prostate: Secondary | ICD-10-CM

## 2021-08-31 DIAGNOSIS — C61 Malignant neoplasm of prostate: Secondary | ICD-10-CM

## 2021-08-31 LAB — MICROSCOPIC EXAMINATION: Bacteria, UA: NONE SEEN

## 2021-08-31 LAB — URINALYSIS, COMPLETE
Bilirubin, UA: NEGATIVE
Glucose, UA: NEGATIVE
Ketones, UA: NEGATIVE
Leukocytes,UA: NEGATIVE
Nitrite, UA: NEGATIVE
Protein,UA: NEGATIVE
Specific Gravity, UA: 1.02 (ref 1.005–1.030)
Urobilinogen, Ur: 0.2 mg/dL (ref 0.2–1.0)
pH, UA: 5.5 (ref 5.0–7.5)

## 2021-09-07 ENCOUNTER — Telehealth: Payer: Self-pay | Admitting: Urology

## 2021-09-07 NOTE — Telephone Encounter (Signed)
Spoke with patient today about getting is PET scan. He stated that he wanted to have this done at [facility], He stated that he has his doctor's there that he see's and he has an appt ?On 09/22/21. He wants to have it done there. He said he would reach out to them and have them order it. ?Sharyn Lull ?

## 2022-08-15 DIAGNOSIS — E118 Type 2 diabetes mellitus with unspecified complications: Secondary | ICD-10-CM | POA: Diagnosis not present

## 2022-08-15 DIAGNOSIS — I2510 Atherosclerotic heart disease of native coronary artery without angina pectoris: Secondary | ICD-10-CM | POA: Diagnosis not present

## 2022-08-15 DIAGNOSIS — Z125 Encounter for screening for malignant neoplasm of prostate: Secondary | ICD-10-CM | POA: Diagnosis not present

## 2022-08-15 DIAGNOSIS — I10 Essential (primary) hypertension: Secondary | ICD-10-CM | POA: Diagnosis not present

## 2022-08-15 DIAGNOSIS — C61 Malignant neoplasm of prostate: Secondary | ICD-10-CM | POA: Diagnosis not present

## 2022-08-21 DIAGNOSIS — I10 Essential (primary) hypertension: Secondary | ICD-10-CM | POA: Diagnosis not present

## 2022-08-21 DIAGNOSIS — Z0000 Encounter for general adult medical examination without abnormal findings: Secondary | ICD-10-CM | POA: Diagnosis not present

## 2022-08-21 DIAGNOSIS — Z1331 Encounter for screening for depression: Secondary | ICD-10-CM | POA: Diagnosis not present

## 2022-08-21 DIAGNOSIS — Z6837 Body mass index (BMI) 37.0-37.9, adult: Secondary | ICD-10-CM | POA: Diagnosis not present

## 2022-08-21 DIAGNOSIS — I2510 Atherosclerotic heart disease of native coronary artery without angina pectoris: Secondary | ICD-10-CM | POA: Diagnosis not present

## 2022-08-21 DIAGNOSIS — E118 Type 2 diabetes mellitus with unspecified complications: Secondary | ICD-10-CM | POA: Diagnosis not present

## 2022-08-21 DIAGNOSIS — E782 Mixed hyperlipidemia: Secondary | ICD-10-CM | POA: Diagnosis not present

## 2022-08-21 DIAGNOSIS — Z6835 Body mass index (BMI) 35.0-35.9, adult: Secondary | ICD-10-CM | POA: Diagnosis not present

## 2022-10-19 DIAGNOSIS — C61 Malignant neoplasm of prostate: Secondary | ICD-10-CM | POA: Diagnosis not present

## 2022-10-19 DIAGNOSIS — C775 Secondary and unspecified malignant neoplasm of intrapelvic lymph nodes: Secondary | ICD-10-CM | POA: Diagnosis not present

## 2023-01-18 DIAGNOSIS — C775 Secondary and unspecified malignant neoplasm of intrapelvic lymph nodes: Secondary | ICD-10-CM | POA: Diagnosis not present

## 2023-01-18 DIAGNOSIS — C61 Malignant neoplasm of prostate: Secondary | ICD-10-CM | POA: Diagnosis not present

## 2023-01-25 DIAGNOSIS — C775 Secondary and unspecified malignant neoplasm of intrapelvic lymph nodes: Secondary | ICD-10-CM | POA: Diagnosis not present

## 2023-01-25 DIAGNOSIS — R972 Elevated prostate specific antigen [PSA]: Secondary | ICD-10-CM | POA: Diagnosis not present

## 2023-01-25 DIAGNOSIS — C61 Malignant neoplasm of prostate: Secondary | ICD-10-CM | POA: Diagnosis not present

## 2023-02-07 DIAGNOSIS — Z125 Encounter for screening for malignant neoplasm of prostate: Secondary | ICD-10-CM | POA: Diagnosis not present

## 2023-02-07 DIAGNOSIS — I2510 Atherosclerotic heart disease of native coronary artery without angina pectoris: Secondary | ICD-10-CM | POA: Diagnosis not present

## 2023-02-07 DIAGNOSIS — Z6835 Body mass index (BMI) 35.0-35.9, adult: Secondary | ICD-10-CM | POA: Diagnosis not present

## 2023-02-07 DIAGNOSIS — E118 Type 2 diabetes mellitus with unspecified complications: Secondary | ICD-10-CM | POA: Diagnosis not present

## 2023-02-14 DIAGNOSIS — Z23 Encounter for immunization: Secondary | ICD-10-CM | POA: Diagnosis not present

## 2023-02-14 DIAGNOSIS — E782 Mixed hyperlipidemia: Secondary | ICD-10-CM | POA: Diagnosis not present

## 2023-02-14 DIAGNOSIS — I10 Essential (primary) hypertension: Secondary | ICD-10-CM | POA: Diagnosis not present

## 2023-02-14 DIAGNOSIS — Z6837 Body mass index (BMI) 37.0-37.9, adult: Secondary | ICD-10-CM | POA: Diagnosis not present

## 2023-02-14 DIAGNOSIS — E119 Type 2 diabetes mellitus without complications: Secondary | ICD-10-CM | POA: Diagnosis not present

## 2023-02-14 DIAGNOSIS — I2510 Atherosclerotic heart disease of native coronary artery without angina pectoris: Secondary | ICD-10-CM | POA: Diagnosis not present

## 2023-05-23 DIAGNOSIS — I2510 Atherosclerotic heart disease of native coronary artery without angina pectoris: Secondary | ICD-10-CM | POA: Diagnosis not present

## 2023-05-23 DIAGNOSIS — I6523 Occlusion and stenosis of bilateral carotid arteries: Secondary | ICD-10-CM | POA: Diagnosis not present

## 2023-05-23 DIAGNOSIS — R0602 Shortness of breath: Secondary | ICD-10-CM | POA: Diagnosis not present

## 2023-05-23 DIAGNOSIS — E782 Mixed hyperlipidemia: Secondary | ICD-10-CM | POA: Diagnosis not present

## 2023-05-23 DIAGNOSIS — I10 Essential (primary) hypertension: Secondary | ICD-10-CM | POA: Diagnosis not present

## 2023-05-23 DIAGNOSIS — E118 Type 2 diabetes mellitus with unspecified complications: Secondary | ICD-10-CM | POA: Diagnosis not present

## 2023-05-23 DIAGNOSIS — Z6835 Body mass index (BMI) 35.0-35.9, adult: Secondary | ICD-10-CM | POA: Diagnosis not present

## 2023-06-07 DIAGNOSIS — R0602 Shortness of breath: Secondary | ICD-10-CM | POA: Diagnosis not present

## 2023-06-07 DIAGNOSIS — I6523 Occlusion and stenosis of bilateral carotid arteries: Secondary | ICD-10-CM | POA: Diagnosis not present

## 2023-06-07 DIAGNOSIS — I2510 Atherosclerotic heart disease of native coronary artery without angina pectoris: Secondary | ICD-10-CM | POA: Diagnosis not present

## 2023-07-23 DIAGNOSIS — I2510 Atherosclerotic heart disease of native coronary artery without angina pectoris: Secondary | ICD-10-CM | POA: Diagnosis not present

## 2023-07-23 DIAGNOSIS — C61 Malignant neoplasm of prostate: Secondary | ICD-10-CM | POA: Diagnosis not present

## 2023-07-23 DIAGNOSIS — E118 Type 2 diabetes mellitus with unspecified complications: Secondary | ICD-10-CM | POA: Diagnosis not present

## 2023-07-23 DIAGNOSIS — I10 Essential (primary) hypertension: Secondary | ICD-10-CM | POA: Diagnosis not present

## 2023-07-23 DIAGNOSIS — C775 Secondary and unspecified malignant neoplasm of intrapelvic lymph nodes: Secondary | ICD-10-CM | POA: Diagnosis not present

## 2023-07-23 DIAGNOSIS — E782 Mixed hyperlipidemia: Secondary | ICD-10-CM | POA: Diagnosis not present

## 2023-08-08 DIAGNOSIS — E118 Type 2 diabetes mellitus with unspecified complications: Secondary | ICD-10-CM | POA: Diagnosis not present

## 2023-08-08 DIAGNOSIS — I2510 Atherosclerotic heart disease of native coronary artery without angina pectoris: Secondary | ICD-10-CM | POA: Diagnosis not present

## 2023-08-08 DIAGNOSIS — E782 Mixed hyperlipidemia: Secondary | ICD-10-CM | POA: Diagnosis not present

## 2023-08-14 ENCOUNTER — Encounter (INDEPENDENT_AMBULATORY_CARE_PROVIDER_SITE_OTHER): Payer: Self-pay | Admitting: Nurse Practitioner

## 2023-08-14 ENCOUNTER — Ambulatory Visit (INDEPENDENT_AMBULATORY_CARE_PROVIDER_SITE_OTHER): Payer: Self-pay | Admitting: Nurse Practitioner

## 2023-08-14 VITALS — BP 134/68 | HR 66 | Resp 18 | Ht 72.0 in | Wt 270.0 lb

## 2023-08-14 DIAGNOSIS — I6523 Occlusion and stenosis of bilateral carotid arteries: Secondary | ICD-10-CM

## 2023-08-14 DIAGNOSIS — E118 Type 2 diabetes mellitus with unspecified complications: Secondary | ICD-10-CM

## 2023-08-14 DIAGNOSIS — I10 Essential (primary) hypertension: Secondary | ICD-10-CM | POA: Diagnosis not present

## 2023-08-14 NOTE — Progress Notes (Signed)
 Subjective:    Patient ID: Marc Macias, male    DOB: November 08, 1945, 78 y.o.   MRN: [id number] Chief Complaint  Patient presents with   New Patient (Initial Visit)    Ref Madaline Guthrie consult bilateral carotid stenosis    Marc Macias is a 78 year old male who was referred by Minda Ditto, PA-C due to significant carotid artery disease bilaterally.  He notes that has been followed by his cardiologist for years but it was recently noted to have worsening.  He has studies indicate that he has a greater than 70% stenosis in the right ICA with possibly greater than 79% stenosis of the left common carotid artery.  He had antegrade flow in the bilateral vertebrals as well as normal flow hemodynamics in the bilateral subclavian arteries.  He currently denies any amaurosis fugax, TIA or CVA-like symptoms.  No syncopal episodes.    Review of Systems  All other systems reviewed and are negative.      Objective:   Physical Exam Vitals reviewed.  HENT:     Head: Normocephalic.  Cardiovascular:     Rate and Rhythm: Normal rate.     Pulses: Normal pulses.  Pulmonary:     Effort: Pulmonary effort is normal.  Skin:    General: Skin is warm and dry.  Neurological:     Mental Status: He is alert and oriented to person, place, and time.  Psychiatric:        Mood and Affect: Mood normal.        Behavior: Behavior normal.        Thought Content: Thought content normal.        Judgment: Judgment normal.     BP 134/68   Pulse 66   Resp 18   Ht 6' (1.829 m)   Wt 270 lb (122.5 kg)   BMI 36.62 kg/m   Past Medical History:  Diagnosis Date   Arteriosclerosis of coronary artery 01/29/2015   Overview:  Status post anteroapical myocardial infarction, PCI and stent placement LAD 02/2009   Last Assessment & Plan:  Status post anteroapical myocardial infarction, PCI and stent placement LAD 02/2009. Not having significant exertional chest pain. Taking medications for coronary disease without problems.  Myalgia's are not present.     Arthritis    Benign essential HTN 10/16/2014   Last Assessment & Plan:  Blood pressure has been controlled without significant dizzyness or associated fatigue. Taking antihypertensives as directed without difficulty. Improved over time.     Coronary artery disease    stent 2008   Current tear knee, lateral meniscus 11/06/2014   Family history of adverse reaction to anesthesia    HLD (hyperlipidemia)    Hypertension    Myocardial infarction [facility])    2008   Prostate cancer ([facility])    Renal mass, left     Social History   Socioeconomic History   Marital status: Married    Spouse name: Not on file   Number of children: Not on file   Years of education: Not on file   Highest education level: Not on file  Occupational History   Not on file  Tobacco Use   Smoking status: Former    Current packs/day: 0.00    Average packs/day: 1 pack/day for 40.0 years (40.0 ttl pk-yrs)    Types: Cigarettes    Start date: 07/20/1968    Quit date: 07/20/2008    Years since quitting: 15.0   Smokeless tobacco: Not on file   Tobacco  comments:    quit 6 years ago 2010  Vaping Use   Vaping status: Never Used  Substance and Sexual Activity   Alcohol use: Not Currently    Comment: rarely   Drug use: No   Sexual activity: Not on file  Other Topics Concern   Not on file  Social History Narrative   Not on file   Social Drivers of Health   Financial Resource Strain: Low Risk  (02/14/2023)   Received from [facility]   Overall Financial Resource Strain (CARDIA)    Difficulty of Paying Living Expenses: Not hard at all  Food Insecurity: No Food Insecurity (02/14/2023)   Received from [facility]   Hunger Vital Sign    Worried About Running Out of Food in the Last Year: Never true    Ran Out of Food in the Last Year: Never true  Transportation Needs: No Transportation Needs (02/14/2023)   Received from [facility] - Transportation    In the past 12 months, has lack of transportation kept you from medical appointments or from getting medications?: No    Lack of Transportation (Non-Medical): No  Physical Activity: Not on file  Stress: Not on file  Social Connections: Not on file  Intimate Partner Violence: Not on file    Past Surgical History:  Procedure Laterality Date   CARDIAC CATHETERIZATION     COLONOSCOPY WITH PROPOFOL N/A 12/01/2020   Procedure: COLONOSCOPY WITH PROPOFOL;  Surgeon: Toledo, Boykin Nearing, MD;  Location: [facility] ENDOSCOPY;  Service: Gastroenterology;  Laterality: N/A;   CORONARY ANGIOPLASTY     DIAGNOSTIC LAPAROSCOPY     KNEE ARTHROSCOPY WITH MENISCAL REPAIR Right 12/03/2014   Procedure: KNEE ARTHROSCOPY WITH MENISCAL REPAIR, Partial medial meniscectomy, Lateral meniscus repair with partial meniscectomy, Abrasion chondraplasty of Femoral tropia;  Surgeon: Christena Flake, MD;  Location: [facility] ORS;  Service: Orthopedics;  Laterality: Right;   ROBOT ASSISTED LAPAROSCOPIC RADICAL PROSTATECTOMY      Family History  Problem Relation Age of Onset   Benign prostatic hyperplasia Father    Kidney disease Neg Hx    Prostate cancer Neg Hx    Bladder Cancer Neg Hx    Kidney cancer Neg Hx     No Known Allergies     Latest Ref Rng & Units 11/30/2014    7:43 AM  CBC  WBC 3.8 - 10.6 K/uL 8.4   Hemoglobin 13.0 - 18.0 g/dL 78.2   Hematocrit 95.6 - 52.0 % 39.9   Platelets 150 - 440 K/uL 200       CMP     Component Value Date/Time   K 4.1 11/30/2014 0743     No results found.     Assessment & Plan:   1. Bilateral carotid artery stenosis (Primary) I had a long discussion with the patient regarding carotid artery disease as well as the pathophysiology and criteria for repair.  Following reviewing the patient's studies, I recommended that he undergo a CT angiogram in order to evaluate the carotid arteries for possible and likely surgical intervention (whether in the form of an  endarterectomy versus stenting).  We discussed both options extensively.  The patient noted that he would prefer to have his surgery done with [facility] who did his previous surgeries.  In order to help facilitate his care a bit quicker we will place a referral to vascular surgery.  He is advised that if he wishes to proceed more locally we  be happy to pursue care for the patient. 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  Hgb A1C to be monitored as already arranged by primary service  3. Benign essential HTN Continue antihypertensive medications as already ordered, these medications have been reviewed and there are no changes at this time.   Current Outpatient Medications on File Prior to Visit  Medication Sig Dispense Refill   aspirin 81 MG tablet Take 81 mg by mouth daily.     atorvastatin (LIPITOR) 40 MG tablet Take by mouth.     Ibuprofen-Diphenhydramine Cit (ADVIL PM PO) Take by mouth at bedtime.     lisinopril-hydrochlorothiazide (PRINZIDE,ZESTORETIC) 20-12.5 MG per tablet Take 1 tablet by mouth daily.     No current facility-administered medications on file prior to visit.    There are no Patient Instructions on file for this visit. No follow-ups on file.   Georgiana Spinner, NP

## 2023-08-15 DIAGNOSIS — E782 Mixed hyperlipidemia: Secondary | ICD-10-CM | POA: Diagnosis not present

## 2023-08-15 DIAGNOSIS — Z6836 Body mass index (BMI) 36.0-36.9, adult: Secondary | ICD-10-CM | POA: Diagnosis not present

## 2023-08-15 DIAGNOSIS — E119 Type 2 diabetes mellitus without complications: Secondary | ICD-10-CM | POA: Diagnosis not present

## 2023-08-15 DIAGNOSIS — E118 Type 2 diabetes mellitus with unspecified complications: Secondary | ICD-10-CM | POA: Diagnosis not present

## 2023-08-15 DIAGNOSIS — I10 Essential (primary) hypertension: Secondary | ICD-10-CM | POA: Diagnosis not present

## 2023-10-11 ENCOUNTER — Telehealth (INDEPENDENT_AMBULATORY_CARE_PROVIDER_SITE_OTHER): Payer: Self-pay

## 2023-10-11 NOTE — Telephone Encounter (Signed)
 His only US  was done at [facility] on 06/07/2023, we recommended a CT to be ordered but he wanted to be seen at [facility] so none was ordered

## 2023-10-11 NOTE — Telephone Encounter (Addendum)
 Rene Carrier called from [facility] left a voicemail stating that Maijor is coming there for a second opinion and would like to know is there any US  or CT results of the patients carotid that is viewable. After speaking with her and looking at his chart no CT or US  was done at his visit to send. Aaron Aas

## 2023-10-17 DIAGNOSIS — Z87891 Personal history of nicotine dependence: Secondary | ICD-10-CM | POA: Diagnosis not present

## 2023-10-17 DIAGNOSIS — I6523 Occlusion and stenosis of bilateral carotid arteries: Secondary | ICD-10-CM | POA: Diagnosis not present

## 2023-12-12 DIAGNOSIS — I10 Essential (primary) hypertension: Secondary | ICD-10-CM | POA: Diagnosis not present

## 2023-12-12 DIAGNOSIS — E118 Type 2 diabetes mellitus with unspecified complications: Secondary | ICD-10-CM | POA: Diagnosis not present

## 2023-12-12 DIAGNOSIS — C61 Malignant neoplasm of prostate: Secondary | ICD-10-CM | POA: Diagnosis not present

## 2023-12-12 DIAGNOSIS — Z6835 Body mass index (BMI) 35.0-35.9, adult: Secondary | ICD-10-CM | POA: Diagnosis not present

## 2023-12-12 DIAGNOSIS — Z125 Encounter for screening for malignant neoplasm of prostate: Secondary | ICD-10-CM | POA: Diagnosis not present

## 2023-12-19 DIAGNOSIS — I2510 Atherosclerotic heart disease of native coronary artery without angina pectoris: Secondary | ICD-10-CM | POA: Diagnosis not present

## 2023-12-19 DIAGNOSIS — Z0000 Encounter for general adult medical examination without abnormal findings: Secondary | ICD-10-CM | POA: Diagnosis not present

## 2023-12-19 DIAGNOSIS — Z1331 Encounter for screening for depression: Secondary | ICD-10-CM | POA: Diagnosis not present

## 2023-12-19 DIAGNOSIS — E119 Type 2 diabetes mellitus without complications: Secondary | ICD-10-CM | POA: Diagnosis not present

## 2023-12-19 DIAGNOSIS — I10 Essential (primary) hypertension: Secondary | ICD-10-CM | POA: Diagnosis not present

## 2023-12-19 DIAGNOSIS — I6523 Occlusion and stenosis of bilateral carotid arteries: Secondary | ICD-10-CM | POA: Diagnosis not present

## 2023-12-19 DIAGNOSIS — R202 Paresthesia of skin: Secondary | ICD-10-CM | POA: Diagnosis not present

## 2023-12-19 DIAGNOSIS — E782 Mixed hyperlipidemia: Secondary | ICD-10-CM | POA: Diagnosis not present

## 2024-01-29 DIAGNOSIS — I2510 Atherosclerotic heart disease of native coronary artery without angina pectoris: Secondary | ICD-10-CM | POA: Diagnosis not present

## 2024-01-29 DIAGNOSIS — C61 Malignant neoplasm of prostate: Secondary | ICD-10-CM | POA: Diagnosis not present

## 2024-01-29 DIAGNOSIS — C775 Secondary and unspecified malignant neoplasm of intrapelvic lymph nodes: Secondary | ICD-10-CM | POA: Diagnosis not present

## 2024-01-29 DIAGNOSIS — E291 Testicular hypofunction: Secondary | ICD-10-CM | POA: Diagnosis not present

## 2024-02-20 DIAGNOSIS — I10 Essential (primary) hypertension: Secondary | ICD-10-CM | POA: Diagnosis not present

## 2024-02-20 DIAGNOSIS — E782 Mixed hyperlipidemia: Secondary | ICD-10-CM | POA: Diagnosis not present

## 2024-02-20 DIAGNOSIS — I2510 Atherosclerotic heart disease of native coronary artery without angina pectoris: Secondary | ICD-10-CM | POA: Diagnosis not present

## 2024-02-20 DIAGNOSIS — E118 Type 2 diabetes mellitus with unspecified complications: Secondary | ICD-10-CM | POA: Diagnosis not present

## 2024-02-20 DIAGNOSIS — Z6835 Body mass index (BMI) 35.0-35.9, adult: Secondary | ICD-10-CM | POA: Diagnosis not present

## 2024-02-28 ENCOUNTER — Other Ambulatory Visit: Payer: Self-pay

## 2024-02-28 ENCOUNTER — Observation Stay: Admission: EM | Admit: 2024-02-28 | Discharge: 2024-03-02 | Disposition: A | Source: Ambulatory Visit

## 2024-02-28 DIAGNOSIS — Z8546 Personal history of malignant neoplasm of prostate: Secondary | ICD-10-CM | POA: Insufficient documentation

## 2024-02-28 DIAGNOSIS — E875 Hyperkalemia: Secondary | ICD-10-CM | POA: Diagnosis not present

## 2024-02-28 DIAGNOSIS — Z87891 Personal history of nicotine dependence: Secondary | ICD-10-CM | POA: Diagnosis not present

## 2024-02-28 DIAGNOSIS — I10 Essential (primary) hypertension: Secondary | ICD-10-CM | POA: Diagnosis not present

## 2024-02-28 DIAGNOSIS — E785 Hyperlipidemia, unspecified: Secondary | ICD-10-CM | POA: Diagnosis not present

## 2024-02-28 DIAGNOSIS — E1100 Type 2 diabetes mellitus with hyperosmolarity without nonketotic hyperglycemic-hyperosmolar coma (NKHHC): Secondary | ICD-10-CM

## 2024-02-28 DIAGNOSIS — E1165 Type 2 diabetes mellitus with hyperglycemia: Principal | ICD-10-CM | POA: Diagnosis present

## 2024-02-28 DIAGNOSIS — N179 Acute kidney failure, unspecified: Secondary | ICD-10-CM | POA: Diagnosis not present

## 2024-02-28 DIAGNOSIS — R739 Hyperglycemia, unspecified: Principal | ICD-10-CM

## 2024-02-28 DIAGNOSIS — I2510 Atherosclerotic heart disease of native coronary artery without angina pectoris: Secondary | ICD-10-CM | POA: Insufficient documentation

## 2024-02-28 DIAGNOSIS — R7989 Other specified abnormal findings of blood chemistry: Secondary | ICD-10-CM | POA: Diagnosis present

## 2024-02-28 DIAGNOSIS — E871 Hypo-osmolality and hyponatremia: Secondary | ICD-10-CM | POA: Diagnosis not present

## 2024-02-28 DIAGNOSIS — Z7982 Long term (current) use of aspirin: Secondary | ICD-10-CM | POA: Insufficient documentation

## 2024-02-28 DIAGNOSIS — Z79899 Other long term (current) drug therapy: Secondary | ICD-10-CM | POA: Insufficient documentation

## 2024-02-28 DIAGNOSIS — R634 Abnormal weight loss: Secondary | ICD-10-CM | POA: Diagnosis not present

## 2024-02-28 LAB — BASIC METABOLIC PANEL WITH GFR
Anion gap: 14 (ref 5–15)
BUN: 36 mg/dL — ABNORMAL HIGH (ref 8–23)
CO2: 25 mmol/L (ref 22–32)
Calcium: 8.9 mg/dL (ref 8.9–10.3)
Chloride: 92 mmol/L — ABNORMAL LOW (ref 98–111)
Creatinine, Ser: 1.26 mg/dL — ABNORMAL HIGH (ref 0.61–1.24)
GFR, Estimated: 59 mL/min — ABNORMAL LOW (ref >60)
Glucose, Bld: 446 mg/dL — ABNORMAL HIGH (ref 70–99)
Potassium: 4.5 mmol/L (ref 3.5–5.1)
Sodium: 131 mmol/L — ABNORMAL LOW (ref 135–145)

## 2024-02-28 LAB — CBC WITH DIFFERENTIAL/PLATELET
Abs Immature Granulocytes: 0.03 K/uL (ref 0.00–0.07)
Basophils Absolute: 0.1 K/uL (ref 0.0–0.1)
Basophils Relative: 1 %
Eosinophils Absolute: 0.2 K/uL (ref 0.0–0.5)
Eosinophils Relative: 1 %
HCT: 42.3 % (ref 39.0–52.0)
Hemoglobin: 14.8 g/dL (ref 13.0–17.0)
Immature Granulocytes: 0 %
Lymphocytes Relative: 10 %
Lymphs Abs: 1.1 K/uL (ref 0.7–4.0)
MCH: 29 pg (ref 26.0–34.0)
MCHC: 35 g/dL (ref 30.0–36.0)
MCV: 82.8 fL (ref 80.0–100.0)
Monocytes Absolute: 0.5 K/uL (ref 0.1–1.0)
Monocytes Relative: 5 %
Neutro Abs: 9.4 K/uL — ABNORMAL HIGH (ref 1.7–7.7)
Neutrophils Relative %: 83 %
Platelets: 227 K/uL (ref 150–400)
RBC: 5.11 MIL/uL (ref 4.22–5.81)
RDW: 13.6 % (ref 11.5–15.5)
WBC: 11.3 K/uL — ABNORMAL HIGH (ref 4.0–10.5)
nRBC: 0 % (ref 0.0–0.2)

## 2024-02-28 LAB — COMPREHENSIVE METABOLIC PANEL WITH GFR
ALT: 28 U/L (ref 0–44)
AST: 27 U/L (ref 15–41)
Albumin: 4.2 g/dL (ref 3.5–5.0)
Alkaline Phosphatase: 104 U/L (ref 38–126)
Anion gap: 15 (ref 5–15)
BUN: 43 mg/dL — ABNORMAL HIGH (ref 8–23)
CO2: 25 mmol/L (ref 22–32)
Calcium: 9.8 mg/dL (ref 8.9–10.3)
Chloride: 82 mmol/L — ABNORMAL LOW (ref 98–111)
Creatinine, Ser: 1.47 mg/dL — ABNORMAL HIGH (ref 0.61–1.24)
GFR, Estimated: 49 mL/min — ABNORMAL LOW (ref >60)
Glucose, Bld: 796 mg/dL (ref 70–99)
Potassium: 5.4 mmol/L — ABNORMAL HIGH (ref 3.5–5.1)
Sodium: 122 mmol/L — ABNORMAL LOW (ref 135–145)
Total Bilirubin: 1.1 mg/dL (ref 0.0–1.2)
Total Protein: 7.9 g/dL (ref 6.5–8.1)

## 2024-02-28 LAB — CBC
HCT: 39.5 % (ref 39.0–52.0)
Hemoglobin: 13.9 g/dL (ref 13.0–17.0)
MCH: 28.7 pg (ref 26.0–34.0)
MCHC: 35.2 g/dL (ref 30.0–36.0)
MCV: 81.6 fL (ref 80.0–100.0)
Platelets: 196 K/uL (ref 150–400)
RBC: 4.84 MIL/uL (ref 4.22–5.81)
RDW: 13.4 % (ref 11.5–15.5)
WBC: 8.9 K/uL (ref 4.0–10.5)
nRBC: 0 % (ref 0.0–0.2)

## 2024-02-28 LAB — URINALYSIS, ROUTINE W REFLEX MICROSCOPIC
Bacteria, UA: NONE SEEN
Bilirubin Urine: NEGATIVE
Glucose, UA: >=500 mg/dL — AB
Hgb urine dipstick: NEGATIVE
Ketones, ur: NEGATIVE mg/dL
Leukocytes,Ua: NEGATIVE
Nitrite: NEGATIVE
Protein, ur: NEGATIVE mg/dL
Specific Gravity, Urine: 1.026 (ref 1.005–1.030)
pH: 5 (ref 5.0–8.0)

## 2024-02-28 LAB — GLUCOSE, CAPILLARY
Glucose-Capillary: 305 mg/dL — ABNORMAL HIGH (ref 70–99)
Glucose-Capillary: 383 mg/dL — ABNORMAL HIGH (ref 70–99)
Glucose-Capillary: 425 mg/dL — ABNORMAL HIGH (ref 70–99)
Glucose-Capillary: 488 mg/dL — ABNORMAL HIGH (ref 70–99)

## 2024-02-28 LAB — MRSA NEXT GEN BY PCR, NASAL: MRSA by PCR Next Gen: NOT DETECTED

## 2024-02-28 LAB — CBG MONITORING, ED: Glucose-Capillary: >600 mg/dL (ref 70–99)

## 2024-02-28 LAB — OSMOLALITY: Osmolality: 309 mosm/kg — ABNORMAL HIGH (ref 275–295)

## 2024-02-28 MED ORDER — DEXTROSE IN LACTATED RINGERS 5 % IV SOLN: INTRAVENOUS | Status: AC

## 2024-02-28 MED ORDER — LACTATED RINGERS IV BOLUS
20.0000 mL/kg | Freq: Once | INTRAVENOUS | Status: AC
  Administered 2024-02-28: 2186 mL via INTRAVENOUS

## 2024-02-28 MED ORDER — SODIUM CHLORIDE 0.9 % IV BOLUS
1000.0000 mL | Freq: Once | INTRAVENOUS | Status: AC
  Administered 2024-02-28: 1000 mL via INTRAVENOUS

## 2024-02-28 MED ORDER — DEXTROSE 50 % IV SOLN: 0.0000 mL | INTRAVENOUS | Status: DC | PRN

## 2024-02-28 MED ORDER — ACETAMINOPHEN 325 MG PO TABS: 650.0000 mg | ORAL_TABLET | Freq: Four times a day (QID) | ORAL | Status: DC | PRN

## 2024-02-28 MED ORDER — INSULIN REGULAR(HUMAN) IN NACL 100-0.9 UT/100ML-% IV SOLN
INTRAVENOUS | Status: DC
  Administered 2024-02-28: 13 [IU]/h via INTRAVENOUS
  Filled 2024-02-28: qty 100

## 2024-02-28 MED ORDER — ORAL CARE MOUTH RINSE: 15.0000 mL | OROMUCOSAL | Status: DC | PRN

## 2024-02-28 MED ORDER — INSULIN ASPART 100 UNIT/ML IJ SOLN
10.0000 [IU] | Freq: Once | INTRAMUSCULAR | Status: AC
  Administered 2024-02-28: 10 [IU] via INTRAVENOUS
  Filled 2024-02-28: qty 10

## 2024-02-28 MED ORDER — ENOXAPARIN SODIUM 60 MG/0.6ML IJ SOSY
0.5000 mg/kg | PREFILLED_SYRINGE | INTRAMUSCULAR | Status: DC
  Administered 2024-02-28 – 2024-03-01 (×3): 55 mg via SUBCUTANEOUS
  Filled 2024-02-28 (×3): qty 0.6

## 2024-02-28 MED ORDER — POTASSIUM CHLORIDE 10 MEQ/100ML IV SOLN: 10.0000 meq | INTRAVENOUS | Status: DC

## 2024-02-28 MED ORDER — CHLORHEXIDINE GLUCONATE CLOTH 2 % EX PADS
6.0000 | MEDICATED_PAD | Freq: Every day | CUTANEOUS | Status: DC
  Administered 2024-02-28 – 2024-03-01 (×3): 6 via TOPICAL
  Filled 2024-02-28: qty 6

## 2024-02-28 MED ORDER — ONDANSETRON HCL 4 MG/2ML IJ SOLN: 4.0000 mg | INTRAMUSCULAR | Status: DC | PRN

## 2024-02-28 MED ORDER — MAGNESIUM HYDROXIDE 400 MG/5ML PO SUSP: 30.0000 mL | Freq: Every day | ORAL | Status: DC | PRN

## 2024-02-28 MED ORDER — ENOXAPARIN SODIUM 40 MG/0.4ML IJ SOSY: 40.0000 mg | PREFILLED_SYRINGE | INTRAMUSCULAR | Status: DC

## 2024-02-28 MED ORDER — TRAZODONE HCL 50 MG PO TABS
25.0000 mg | ORAL_TABLET | Freq: Every evening | ORAL | Status: DC | PRN
Start: 2024-02-28 — End: 2024-03-02

## 2024-02-28 MED ORDER — LACTATED RINGERS IV SOLN: INTRAVENOUS | Status: AC

## 2024-02-28 NOTE — ED Notes (Signed)
 Pt ambulated to and from restroom with a steady gait. Provided water per request. Pt and family state no other needs at this time.

## 2024-02-28 NOTE — ED Triage Notes (Signed)
 Patient states he had labs performed this morning due to feeling tired and weak, no appetite, nausea and weight loss of 40lbs in one month. Was called back stating Glucose was 650 and K 6.5.

## 2024-02-28 NOTE — ED Provider Notes (Signed)
 [facility] Provider Note    Event Date/Time   First MD Initiated Contact with Patient 02/28/24 1703     (approximate)   History   Abnormal Labs   HPI  Marc Macias is a 78 y.o. male presents to the urgency department today at the advice of primary care after abnormal blood work drawn earlier today.  He had blood work drawn because over the past week he has felt extremely fatigued.  This is generalized weakness.  He has not been feeling good for about a month.  At doctor's office his blood work showed significantly elevated blood sugar and elevated potassium.  He does state that his hemoglobin A1c's have been increasing with his most recent one being in 8. He has had increased thirst and urination. Denies any fevers.      Physical Exam   Triage Vital Signs: ED Triage Vitals [02/28/24 1404]  Encounter Vitals Group     BP (!) 149/75     Girls Systolic BP Percentile      Girls Diastolic BP Percentile      Boys Systolic BP Percentile      Boys Diastolic BP Percentile      Pulse Rate 93     Resp 20     Temp 98.1 F (36.7 C)     Temp Source Oral     SpO2 95 %     Weight 241 lb (109.3 kg)     Height 6' (1.829 m)     Head Circumference      Peak Flow      Pain Score 0     Pain Loc      Pain Education      Exclude from Growth Chart     Most recent vital signs: Vitals:   02/28/24 1700 02/28/24 1700  BP: (!) 162/74   Pulse: 89   Resp: 15   Temp:    SpO2: 94% 94%   General: Awake, alert, oriented. CV:  Good peripheral perfusion. Regular rate and rhythm. Resp:  Normal effort. Lungs clear. Abd:  No distention.    ED Results / Procedures / Treatments   Labs (all labs ordered are listed, but only abnormal results are displayed) Labs Reviewed  CBC WITH DIFFERENTIAL/PLATELET - Abnormal; Notable for the following components:      Result Value   WBC 11.3 (*)    Neutro Abs 9.4 (*)    All other components within normal limits  COMPREHENSIVE  METABOLIC PANEL WITH GFR - Abnormal; Notable for the following components:   Sodium 122 (*)    Potassium 5.4 (*)    Chloride 82 (*)    Glucose, Bld 796 (*)    BUN 43 (*)    Creatinine, Ser 1.47 (*)    GFR, Estimated 49 (*)    All other components within normal limits  URINALYSIS, ROUTINE W REFLEX MICROSCOPIC - Abnormal; Notable for the following components:   Color, Urine STRAW (*)    APPearance CLEAR (*)    Glucose, UA >=500 (*)    All other components within normal limits     EKG I, Guadalupe Eagles, attending physician, personally viewed and interpreted this EKG  EKG Time: 1412 Rate: 86 Rhythm: normal sinus rhythm Axis: normal Intervals: qtc 471 QRS: narrow, q waves v1, v2, v3 ST changes: no st elevation Impression: abnormal ekg   RADIOLOGY None   PROCEDURES:  Critical Care performed: No    MEDICATIONS ORDERED IN ED: Medications -  No data to display   IMPRESSION / MDM / ASSESSMENT AND PLAN / ED COURSE  I reviewed the triage vital signs and the nursing notes.                              Differential diagnosis includes, but is not limited to, hyperglycemia, DKA, infection, electrolyte abnormality  Patient's presentation is most consistent with acute presentation with potential threat to life or bodily function.   The patient is on the cardiac monitor to evaluate for evidence of arrhythmia and/or significant heart rate changes.  Patient presented to the emergency department today because of concerns for significantly elevated blood sugar.  Per chart review does appear that his A1c's have been increasing recently.  Blood work shows hyperglycemia but no findings concerning for DKA.  Will give IV fluids.  Will recheck sugar however I would anticipate patient will require admission for better glucose control.  Blood sugar remains high. Will give bolus of insulin and further fluids. Will discuss with hospitalist service for admission.     FINAL CLINICAL  IMPRESSION(S) / ED DIAGNOSES   Final diagnoses:  Hyperglycemia         Note:  This document was prepared using Dragon voice recognition software and may include unintentional dictation errors.    Floy Roberts, MD 02/28/24 2041

## 2024-02-28 NOTE — Progress Notes (Signed)
 PHARMACIST - PHYSICIAN COMMUNICATION  CONCERNING:  Enoxaparin (Lovenox) for DVT Prophylaxis    RECOMMENDATION: Patient was prescribed enoxaprin 40mg  q24 hours for VTE prophylaxis.   Filed Weights   02/28/24 1404  Weight: 109.3 kg (241 lb)    Body mass index is 32.69 kg/m.  Estimated Creatinine Clearance: 54.6 mL/min (A) (by C-G formula based on SCr of 1.47 mg/dL (H)).   Based on [facility] policy patient is candidate for enoxaparin 0.5mg /kg TBW SQ every 24 hours based on BMI being >30.   DESCRIPTION: Pharmacy has adjusted enoxaparin dose per [facility] policy.  Patient is now receiving enoxaparin 55 mg every 24 hours   Marc Macias, PharmD, BCPS Clinical Pharmacist 02/28/2024 9:08 PM

## 2024-02-29 ENCOUNTER — Other Ambulatory Visit (HOSPITAL_COMMUNITY): Payer: Self-pay

## 2024-02-29 ENCOUNTER — Inpatient Hospital Stay

## 2024-02-29 ENCOUNTER — Telehealth (HOSPITAL_COMMUNITY): Payer: Self-pay | Admitting: Pharmacy Technician

## 2024-02-29 DIAGNOSIS — E1100 Type 2 diabetes mellitus with hyperosmolarity without nonketotic hyperglycemic-hyperosmolar coma (NKHHC): Secondary | ICD-10-CM | POA: Diagnosis not present

## 2024-02-29 DIAGNOSIS — E785 Hyperlipidemia, unspecified: Secondary | ICD-10-CM | POA: Insufficient documentation

## 2024-02-29 DIAGNOSIS — N179 Acute kidney failure, unspecified: Secondary | ICD-10-CM

## 2024-02-29 DIAGNOSIS — E871 Hypo-osmolality and hyponatremia: Secondary | ICD-10-CM

## 2024-02-29 DIAGNOSIS — K922 Gastrointestinal hemorrhage, unspecified: Secondary | ICD-10-CM | POA: Diagnosis not present

## 2024-02-29 DIAGNOSIS — E1165 Type 2 diabetes mellitus with hyperglycemia: Secondary | ICD-10-CM | POA: Diagnosis present

## 2024-02-29 DIAGNOSIS — E875 Hyperkalemia: Secondary | ICD-10-CM

## 2024-02-29 DIAGNOSIS — I10 Essential (primary) hypertension: Secondary | ICD-10-CM | POA: Insufficient documentation

## 2024-02-29 LAB — CBC
HCT: 37.9 % — ABNORMAL LOW (ref 39.0–52.0)
Hemoglobin: 12.8 g/dL — ABNORMAL LOW (ref 13.0–17.0)
MCH: 28.6 pg (ref 26.0–34.0)
MCHC: 33.8 g/dL (ref 30.0–36.0)
MCV: 84.6 fL (ref 80.0–100.0)
Platelets: 169 K/uL (ref 150–400)
RBC: 4.48 MIL/uL (ref 4.22–5.81)
RDW: 13.6 % (ref 11.5–15.5)
WBC: 8.6 K/uL (ref 4.0–10.5)
nRBC: 0 % (ref 0.0–0.2)

## 2024-02-29 LAB — BASIC METABOLIC PANEL WITH GFR
Anion gap: 10 (ref 5–15)
Anion gap: 10 (ref 5–15)
Anion gap: 10 (ref 5–15)
Anion gap: 8 (ref 5–15)
BUN: 27 mg/dL — ABNORMAL HIGH (ref 8–23)
BUN: 28 mg/dL — ABNORMAL HIGH (ref 8–23)
BUN: 32 mg/dL — ABNORMAL HIGH (ref 8–23)
BUN: 32 mg/dL — ABNORMAL HIGH (ref 8–23)
CO2: 27 mmol/L (ref 22–32)
CO2: 28 mmol/L (ref 22–32)
CO2: 28 mmol/L (ref 22–32)
CO2: 30 mmol/L (ref 22–32)
Calcium: 8.7 mg/dL — ABNORMAL LOW (ref 8.9–10.3)
Calcium: 8.7 mg/dL — ABNORMAL LOW (ref 8.9–10.3)
Calcium: 8.8 mg/dL — ABNORMAL LOW (ref 8.9–10.3)
Calcium: 8.9 mg/dL (ref 8.9–10.3)
Chloride: 97 mmol/L — ABNORMAL LOW (ref 98–111)
Chloride: 98 mmol/L (ref 98–111)
Chloride: 98 mmol/L (ref 98–111)
Chloride: 98 mmol/L (ref 98–111)
Creatinine, Ser: 0.83 mg/dL (ref 0.61–1.24)
Creatinine, Ser: 0.89 mg/dL (ref 0.61–1.24)
Creatinine, Ser: 0.92 mg/dL (ref 0.61–1.24)
Creatinine, Ser: 1.05 mg/dL (ref 0.61–1.24)
GFR, Estimated: >60 mL/min (ref >60)
GFR, Estimated: >60 mL/min (ref >60)
GFR, Estimated: >60 mL/min (ref >60)
GFR, Estimated: >60 mL/min (ref >60)
Glucose, Bld: 178 mg/dL — ABNORMAL HIGH (ref 70–99)
Glucose, Bld: 208 mg/dL — ABNORMAL HIGH (ref 70–99)
Glucose, Bld: 218 mg/dL — ABNORMAL HIGH (ref 70–99)
Glucose, Bld: 339 mg/dL — ABNORMAL HIGH (ref 70–99)
Potassium: 3.5 mmol/L (ref 3.5–5.1)
Potassium: 3.7 mmol/L (ref 3.5–5.1)
Potassium: 3.9 mmol/L (ref 3.5–5.1)
Potassium: 4.5 mmol/L (ref 3.5–5.1)
Sodium: 135 mmol/L (ref 135–145)
Sodium: 135 mmol/L (ref 135–145)
Sodium: 136 mmol/L (ref 135–145)
Sodium: 136 mmol/L (ref 135–145)

## 2024-02-29 LAB — IRON AND TIBC
Iron: 38 ug/dL — ABNORMAL LOW (ref 45–182)
Saturation Ratios: 13 % — ABNORMAL LOW (ref 17.9–39.5)
TIBC: 286 ug/dL (ref 250–450)
UIBC: 248 ug/dL

## 2024-02-29 LAB — GLUCOSE, CAPILLARY
Glucose-Capillary: 189 mg/dL — ABNORMAL HIGH (ref 70–99)
Glucose-Capillary: 155 mg/dL — ABNORMAL HIGH (ref 70–99)
Glucose-Capillary: 214 mg/dL — ABNORMAL HIGH (ref 70–99)
Glucose-Capillary: 180 mg/dL — ABNORMAL HIGH (ref 70–99)
Glucose-Capillary: 212 mg/dL — ABNORMAL HIGH (ref 70–99)
Glucose-Capillary: 215 mg/dL — ABNORMAL HIGH (ref 70–99)
Glucose-Capillary: 218 mg/dL — ABNORMAL HIGH (ref 70–99)
Glucose-Capillary: 219 mg/dL — ABNORMAL HIGH (ref 70–99)
Glucose-Capillary: 367 mg/dL — ABNORMAL HIGH (ref 70–99)
Glucose-Capillary: 305 mg/dL — ABNORMAL HIGH (ref 70–99)
Glucose-Capillary: 362 mg/dL — ABNORMAL HIGH (ref 70–99)

## 2024-02-29 LAB — FERRITIN: Ferritin: 771 ng/mL — ABNORMAL HIGH (ref 24–336)

## 2024-02-29 MED ORDER — INSULIN ASPART 100 UNIT/ML IJ SOLN: 0.0000 [IU] | Freq: Every day | INTRAMUSCULAR | Status: DC

## 2024-02-29 MED ORDER — INSULIN STARTER KIT- PEN NEEDLES (ENGLISH)
1.0000 | Freq: Once | Status: AC
  Administered 2024-02-29: 1
  Filled 2024-02-29: qty 1

## 2024-02-29 MED ORDER — IOHEXOL 9 MG/ML PO SOLN: 500.0000 mL | ORAL | Status: AC

## 2024-02-29 MED ORDER — PANTOPRAZOLE SODIUM 40 MG IV SOLR
40.0000 mg | Freq: Two times a day (BID) | INTRAVENOUS | Status: DC
  Administered 2024-02-29 – 2024-03-01 (×3): 40 mg via INTRAVENOUS
  Filled 2024-02-29 (×3): qty 10

## 2024-02-29 MED ORDER — INSULIN ASPART 100 UNIT/ML IJ SOLN
0.0000 [IU] | Freq: Every day | INTRAMUSCULAR | Status: DC
  Administered 2024-02-29: 5 [IU] via SUBCUTANEOUS
  Filled 2024-02-29: qty 1

## 2024-02-29 MED ORDER — ATORVASTATIN CALCIUM 20 MG PO TABS
40.0000 mg | ORAL_TABLET | Freq: Every day | ORAL | Status: DC
  Administered 2024-02-29 – 2024-03-02 (×3): 40 mg via ORAL
  Filled 2024-02-29 (×3): qty 2

## 2024-02-29 MED ORDER — INSULIN ASPART 100 UNIT/ML IJ SOLN
0.0000 [IU] | Freq: Three times a day (TID) | INTRAMUSCULAR | Status: DC
  Administered 2024-02-29: 20 [IU] via SUBCUTANEOUS
  Administered 2024-02-29: 15 [IU] via SUBCUTANEOUS
  Administered 2024-02-29: 7 [IU] via SUBCUTANEOUS
  Administered 2024-03-01 (×3): 15 [IU] via SUBCUTANEOUS
  Administered 2024-03-02: 11 [IU] via SUBCUTANEOUS
  Administered 2024-03-02: 15 [IU] via SUBCUTANEOUS
  Filled 2024-02-29 (×8): qty 1

## 2024-02-29 MED ORDER — SALINE SPRAY 0.65 % NA SOLN
1.0000 | NASAL | Status: DC | PRN
  Filled 2024-02-29: qty 44

## 2024-02-29 MED ORDER — HYDROCHLOROTHIAZIDE 12.5 MG PO TABS
12.5000 mg | ORAL_TABLET | Freq: Every day | ORAL | Status: DC
  Administered 2024-02-29 – 2024-03-02 (×3): 12.5 mg via ORAL
  Filled 2024-02-29 (×3): qty 1

## 2024-02-29 MED ORDER — ASPIRIN 81 MG PO TBEC
81.0000 mg | DELAYED_RELEASE_TABLET | Freq: Every day | ORAL | Status: DC
  Administered 2024-02-29 – 2024-03-02 (×3): 81 mg via ORAL
  Filled 2024-02-29 (×3): qty 1

## 2024-02-29 MED ORDER — LISINOPRIL-HYDROCHLOROTHIAZIDE 10-12.5 MG PO TABS: 1.0000 | ORAL_TABLET | Freq: Every day | ORAL | Status: DC

## 2024-02-29 MED ORDER — INSULIN GLARGINE-YFGN 100 UNIT/ML ~~LOC~~ SOLN
15.0000 [IU] | Freq: Every day | SUBCUTANEOUS | Status: DC
  Administered 2024-02-29: 15 [IU] via SUBCUTANEOUS
  Filled 2024-02-29: qty 0.15

## 2024-02-29 MED ORDER — LIVING WELL WITH DIABETES BOOK
Freq: Once | Status: AC
  Filled 2024-02-29: qty 1

## 2024-02-29 MED ORDER — IOHEXOL 300 MG/ML  SOLN
100.0000 mL | Freq: Once | INTRAMUSCULAR | Status: AC | PRN
  Administered 2024-03-01: 100 mL via INTRAVENOUS

## 2024-02-29 MED ORDER — LISINOPRIL 10 MG PO TABS
10.0000 mg | ORAL_TABLET | Freq: Every day | ORAL | Status: DC
  Administered 2024-02-29 – 2024-03-02 (×3): 10 mg via ORAL
  Filled 2024-02-29: qty 2
  Filled 2024-02-29: qty 1
  Filled 2024-02-29: qty 2

## 2024-02-29 MED ORDER — INSULIN GLARGINE-YFGN 100 UNIT/ML ~~LOC~~ SOLN
5.0000 [IU] | Freq: Once | SUBCUTANEOUS | Status: AC
  Administered 2024-02-29: 5 [IU] via SUBCUTANEOUS
  Filled 2024-02-29: qty 0.05

## 2024-02-29 MED ADMIN — Nutritional Supplement Liquid: 237 mL | ORAL | NDC 7007454329

## 2024-02-29 NOTE — Care Management Important Message (Signed)
 Important Message  Patient Details  Name: Marc Macias MRN: [id number] Date of Birth: [birth year]   Important Message Given:  Yes - Medicare IM     [name] 02/29/2024, 3:23 PM

## 2024-02-29 NOTE — Care Management CC44 (Signed)
 Condition Code 44 Documentation Completed  Patient Details  Name: Marc Macias MRN: [id number] Date of Birth: 1945-11-23   Condition Code 44 given:  Yes Patient signature on Condition Code 44 notice:  Yes Documentation of 2 MD's agreement:  Yes Code 44 added to claim:  Yes    Corrie JINNY Ruts, LCSW 02/29/2024, 3:43 PM

## 2024-02-29 NOTE — Progress Notes (Signed)
 Progress Note   Patient: Marc Macias FMW:[id number] DOB: [birth year] DOA: 02/28/2024     1 DOS: the patient was seen and examined on 02/29/2024   Brief hospital course: Per H&P HPI  Marc Macias is a 78 y.o. Caucasian male with medical history significant for type 2 diabetes mellitus, hypertension, dyslipidemia and coronary artery disease and osteoarthritis, presented to the emergency room with acute onset of malaise and lack of appetite and energy as well as polyuria and polydipsia for which she was seen by his PCP earlier today and was having elevated blood glucose level.  He has been having generalized weakness and has not been feeling good for about a month.  His potassium was elevated as well and his hemoglobin A1c has been increasing lately with most recent being 8.  No fever or chills.  No nausea or vomiting or abdominal pain.  No chest pain or palpitations.  No cough or wheezing or dyspnea.  No dysuria, oliguria or hematuria or flank pain.   ED Course: When he came to the ER, BP was 149/75 with otherwise normal vital signs.  Labs revealed a blood glucose of 796 with a BUN of 43 and creatinine 1.47, potassium 5.4 and sodium 122.  CBC showed WBCs of 11.3.  UA showed more than 500 glucose and was otherwise negative. EKG as reviewed by me : EKG showed normal sinus rhythm with a rate of 86 with low voltage QRS and poor R wave progression. Imaging: None.   The patient was given 1 L bolus of IV normal saline and was started on Endo tool with NKH protocol.  He will be admitted to down unit bed for further evaluation and management.  Assessment and Plan: * Uncontrolled type 2 diabetes mellitus with hyperosmolar nonketotic hyperglycemia (HCC) - A1c 8.4, not currently on hypoglycemic medications BG remains elevated.  Patient reports he drank large amounts of fruit juice, and milk over the past several weeks. Which possibly precipitated this acute worsening in his glycemic control.  Inpatient  diabetes management team was consulted and patient was instructed on insulin use.   Will discharge patient on 15u nightly of long acting and he will follow up with his PCP.    GIB, likely upper GI source Patient reports that he has had melenic stools for the past 2 to 3 weeks.  His last melenic bowel movement was 2 days prior to admission. He states that he does not take oral iron and has not recently used pepto bismol for his abdominal pain.  He does take Advil but states he uses this sparingly about 4-5 times over the course of the week. His hemoglobin is decreased this AM likely in the setting of IV fluid administration. He has had a loose BM since admission with no blood in it.  Iron panel was obtained and was notable for iron deficiency. Discussed with patient that he needs to be evaluated by gastroenterology for his GIB. He would like to defer this to the outpatient setting.  Will replace iron Start PPI BID, trasnition to PO in the AM  Trend CBC  Maintain two PIV  Discourage further use of NSAIDs  Weight loss unintentional  Dyspepsia For the past couple weeks patient has had anorexia and GI upset.  He has not had any emesis but notes he has had some nausea.  Has any diarrhea has had some black stools over this time.  He was evaluated by his primary care physician who recommended he undergo CT  scan of the abdomen and pelvis given his history of malignancy this was ordered here however patient would like to defer getting the scan until he speaks with his oncology team.  Unclear exact etiology of patient's symptoms possibly related to malignancy versus his poorly controlled blood sugars.  Will start patient on PPI as noted above He will have follow-up with his primary care physician and oncology team for further workup  Hyperkalemia Resolved  AKI (acute kidney injury) - This is likely prerenal due to volume depletion and dehydration. Resolved with hydration  Hyponatremia - This is  likely pseudohyponatremia from hyperglycemia. Resolved with fluids and correction of blood glucose  Essential hypertension - Will resume home antihypertensives given improvement in AKI  CAD Dyslipidemia - Will continue statin therapy. Hold Aspirin in setting of GIB  H/o prostate cancer s/p prostatectomy and radiation Follows with [facility] oncology regularly       Subjective: Patient reports that over the last 3 weeks he has had a decreased appetite and has also been drinking a lot of water, fruit juice, and milk over this time.  He also states that he was unaware that he had a diagnosis of diabetes.  His A1c was 7.3 01/2023 and 7.4 07/2023, it seems that given his age and the fact that his blood sugar was only slightly not at goal he was monitored.   His most recent A1c was 8.4.  Patient has had a 20 pound weight loss from 9/16 to 10/8.  He has had some anorexia and uneasiness.  He saw his primary care for this who recommended that he get a CT scan of the abdomen pelvis to see if this is related to malignancy.  Patient is currently refusing to get imaging at this time.  Patient also reports that he has had dark black stools for the past 2 weeks.  His last dark stool was 2 days prior to admission.  He had some watery stools.  Here in the hospital with no blood. Physical Exam: Vitals:   02/29/24 1300 02/29/24 1400 02/29/24 1500 02/29/24 1600  BP: (!) 168/89 121/63 138/77 (!) 142/74  Pulse: 69 71 79 65  Resp: 16 17 15 15   Temp:      TempSrc:      SpO2: 94% 92% 93% 95%  Weight:      Height:       Physical Exam  Constitutional: In no distress.  Cardiovascular: Normal rate, regular rhythm. No lower extremity edema  Pulmonary: Non labored breathing on room air, no wheezing or rales.   Abdominal: Soft. Non distended and non tender Musculoskeletal: Normal range of motion.     Neurological: Alert and oriented to person, place, and time. Non focal  Skin: Skin is warm and dry.     Data  Reviewed:     Latest Ref Rng & Units 02/29/2024    4:07 AM 02/28/2024   10:28 PM 02/28/2024    2:07 PM  CBC  WBC 4.0 - 10.5 K/uL 8.6  8.9  11.3   Hemoglobin 13.0 - 17.0 g/dL 87.1  86.0  85.1   Hematocrit 39.0 - 52.0 % 37.9  39.5  42.3   Platelets 150 - 400 K/uL 169  196  227       Latest Ref Rng & Units 02/29/2024   12:42 PM 02/29/2024    8:36 AM 02/29/2024    4:07 AM  BMP  Glucose 70 - 99 mg/dL 660  781  821   BUN 8 -  23 mg/dL 28  27  32   Creatinine 0.61 - 1.24 mg/dL 9.10  9.16  9.07   Sodium 135 - 145 mmol/L 135  136  135   Potassium 3.5 - 5.1 mmol/L 4.5  3.7  3.5   Chloride 98 - 111 mmol/L 97  98  98   CO2 22 - 32 mmol/L 28  30  27    Calcium 8.9 - 10.3 mg/dL 8.9  8.7  8.7      Family Communication: Duaghter and spouse at bedside   Disposition: Status is: Observation The patient remains OBS appropriate and will d/c before 2 midnights. Patient, currently, would like to defer further work up of his weight loss and melenic stools to the outpatient setting.   Planned Discharge Destination: Home    Time spent: 35 minutes  Author: Alban Pepper, MD 02/29/2024 4:26 PM  For on call review [email or web address].

## 2024-02-29 NOTE — Plan of Care (Signed)
  Problem: Education: Goal: Ability to describe self-care measures that may prevent or decrease complications (Diabetes Survival Skills Education) will improve Outcome: Progressing   Problem: Coping: Goal: Ability to adjust to condition or change in health will improve Outcome: Progressing   Problem: Fluid Volume: Goal: Ability to maintain a balanced intake and output will improve Outcome: Progressing   Problem: Health Behavior/Discharge Planning: Goal: Ability to identify and utilize available resources and services will improve Outcome: Progressing Goal: Ability to manage health-related needs will improve Outcome: Progressing   Problem: Nutritional: Goal: Maintenance of adequate nutrition will improve Outcome: Progressing Goal: Progress toward achieving an optimal weight will improve Outcome: Progressing   Problem: Skin Integrity: Goal: Risk for impaired skin integrity will decrease Outcome: Progressing   Problem: Tissue Perfusion: Goal: Adequacy of tissue perfusion will improve Outcome: Progressing   Problem: Nutrition: Goal: Adequate nutrition will be maintained Outcome: Progressing   Problem: Pain Managment: Goal: General experience of comfort will improve and/or be controlled Outcome: Progressing   Problem: Safety: Goal: Ability to remain free from injury will improve Outcome: Progressing

## 2024-02-29 NOTE — Inpatient Diabetes Management (Addendum)
 Inpatient Diabetes Program Recommendations  AACE/ADA: New Consensus Statement on Inpatient Glycemic Control (2015)  Target Ranges:  Prepandial:   less than 140 mg/dL      Peak postprandial:   less than 180 mg/dL (1-2 hours)      Critically ill patients:  140 - 180 mg/dL    Latest Reference Range & Units 02/28/24 14:07  Glucose 70 - 99 mg/dL 203 (HH)  (HH): Data is critically high  Latest Reference Range & Units 02/28/24 20:13 02/28/24 21:57 02/28/24 22:32 02/28/24 23:03 02/28/24 23:58 02/29/24 01:03 02/29/24 02:02 02/29/24 03:06 02/29/24 04:06  Glucose-Capillary 70 - 99 mg/dL >399 (HH)  10 units Novolog  488 (H) 425 (H)  IV Insulin Drip Started 383 (H) 305 (H) 189 (H) 155 (H) 214 (H) 180 (H)    Latest Reference Range & Units 02/29/24 05:02 02/29/24 06:01 02/29/24 06:58  Glucose-Capillary 70 - 99 mg/dL 787 (H) 784 (H)  [place] 5 units 218 (H)  IV Insulin Drip Stopped  (H): Data is abnormally high   Admit with: Uncontrolled type 2 diabetes mellitus with hyperosmolar nonketotic hyperglycemia/ Hyperkalemia  History: DM2  Home DM Meds: None Listed       Rybeleus 3 mg daily (listed in PCP notes)  Current Orders: Novolog Resistant Correction Scale/ SSI (0-20 units) TID AC + HS   Per notes, lost 40# in one month He has had increased thirst and urination  PCP: Dr. Lenon with [facility]  Current A1c Pending Likely needs Insulin for home given Glucose was 796 on admission Lantus $35 co-pay Novolog $35 co-pay  MD- Please add Lantus dose for tonight Recommend 15 units at bedtime (0.15 units/kg)   Addendum 10:45am--Met w/ pt and his wife and Dtr this AM in the ICU.  Pt able to have meaningful conversation.  Pt told me his A1c was around 6% for several years--His PCP Dr. Lenon has been monitoring A1c levels.  Most recent PCP visit showed A1c up to 8%.  Pt shortly after PCP visit, began to have symptoms of high glucose (thirst, freq urination, weakness, fatigue).  Pt told me  PCP gave him a Rx to start Rybeleus 3 mg daily at home 4 weeks ago but he never picked up the med b/c the pharmacy told him it was $300.  Pt does not have CBG meter at home.  Discussed with pt that given his admission glucose was >700 and since he was quite ill, he may need insulin for home.  Pt open and willing to do what he needs to get better.  Wife has diabetes and can help with care at home.  Have attached several QR codes with diabetes educational videos to AVS--Dtr stated she can help pt and wife watch the videos.  Also ordered insulin pen teaching kit and Living well with diabetes book.  Spoke with pt about diabetes diagnosis.  Discussed A1C results with them and explained what an A1C is, basic pathophysiology of DM Type 2, basic home care, basic diabetes diet nutrition principles, importance of checking CBGs and maintaining good CBG control to prevent long-term and short-term complications.  Reviewed signs and symptoms of hyperglycemia and hypoglycemia and how to treat hypoglycemia at home.  Also reviewed blood sugar goals and A1c goals for home.    Also discussed DM diet information with patient.  Encouraged patient to avoid beverages with sugar (regular soda, sweet tea, lemonade, fruit juice) and to consume mostly water.  Discussed what foods contain carbohydrates and how carbohydrates affect the body's  blood sugar levels.  Encouraged patient to be careful with his portion sizes (especially grains, starchy vegetables, and fruits).    Educated patient and spouse on insulin pen use at home.  Reviewed all steps of insulin pen including attachment of needle, 2-unit air shot, dialing up dose, giving injection, rotation of injection sites, removing needle, disposal of sharps, storage of unused insulin, disposal of insulin etc.  Patient's wife able to provide successful return demonstration.  Reviewed troubleshooting with insulin pen.  Pt told me he would prefer for his wife to give the injections when he  goes home (if insulin is started).      --Will follow patient during hospitalization--  Adina Rudolpho Arrow RN, MSN, CDCES Diabetes Coordinator Inpatient Glycemic Control Team Team Pager: [phone] (8a-5p)

## 2024-02-29 NOTE — Telephone Encounter (Signed)
 Patient Product/process development scientist completed.    The patient is insured through [employer]. Patient has Medicare and is not eligible for a copay card, but may be able to apply for patient assistance or Medicare RX Payment Plan (Patient Must reach out to their plan, if eligible for payment plan), if available.    Ran test claim for Lantus Pen and the current 30 day co-pay is $35.00.  Ran test claim for Novolog FlexPen and the current 30 day co-pay is $35.00.    This test claim was processed through [facility]- copay amounts may vary at other pharmacies due to pharmacy/plan contracts, or as the patient moves through the different stages of their insurance plan.     Reyes Sharps, CPHT Pharmacy Technician Patient Advocate Specialist Lead [facility] Patient Advocate Team Direct Number: [phone]  Fax: [phone]

## 2024-02-29 NOTE — Assessment & Plan Note (Signed)
 Will continue statin therapy

## 2024-02-29 NOTE — Progress Notes (Signed)
 Spoke with patient regarding ordered CT Abdomen. Patient refusing to do CT scan this admission.  Patient States he would prefer to visit his Oncologist first and see if they deem it necessary post Discharge from hospital. Dr. Franchot made aware of patient's decision

## 2024-02-29 NOTE — Assessment & Plan Note (Signed)
-   This is likely prerenal due to volume depletion and dehydration. - We will follow BMP with hydration

## 2024-02-29 NOTE — Care Management Obs Status (Signed)
 MEDICARE OBSERVATION STATUS NOTIFICATION   Patient Details  Name: ERVING SASSANO MRN: [id number] Date of Birth: 05-03-1946   Medicare Observation Status Notification Given:  Yes    Corrie JINNY Ruts, LCSW 02/29/2024, 3:43 PM

## 2024-02-29 NOTE — Assessment & Plan Note (Signed)
-   This should improve with hydration and IV insulin drip. - Will follow potassium levels.

## 2024-02-29 NOTE — Assessment & Plan Note (Signed)
-   This is likely pseudohyponatremia from hyperglycemia. - Will follow sodium level with hydration.

## 2024-02-29 NOTE — Assessment & Plan Note (Signed)
-   Will hold off Zestoretic given his acute kidney injury. - He will be placed on as needed IV labetalol.

## 2024-02-29 NOTE — H&P (Signed)
 [place]   PATIENT NAME: Marc Macias    MR#:  [id number]  DATE OF BIRTH:  April 26, 1946  DATE OF ADMISSION:  02/28/2024  PRIMARY CARE PHYSICIAN: Lenon Layman ORN, MD   Patient is coming from: Home  REQUESTING/REFERRING PHYSICIAN: Floy Roberts, MD  CHIEF COMPLAINT:   Chief Complaint  Patient presents with   Abnormal Labs    HISTORY OF PRESENT ILLNESS:  Marc Macias is a 78 y.o. Caucasian male with medical history significant for type 2 diabetes mellitus, hypertension, dyslipidemia and coronary artery disease and osteoarthritis, presented to the emergency room with acute onset of malaise and lack of appetite and energy as well as polyuria and polydipsia for which she was seen by his PCP earlier today and was having elevated blood glucose level.  He has been having generalized weakness and has not been feeling good for about a month.  His potassium was elevated as well and his hemoglobin A1c has been increasing lately with most recent being 8.  No fever or chills.  No nausea or vomiting or abdominal pain.  No chest pain or palpitations.  No cough or wheezing or dyspnea.  No dysuria, oliguria or hematuria or flank pain.  ED Course: When he came to the ER, BP was 149/75 with otherwise normal vital signs.  Labs revealed a blood glucose of 796 with a BUN of 43 and creatinine 1.47, potassium 5.4 and sodium 122.  CBC showed WBCs of 11.3.  UA showed more than 500 glucose and was otherwise negative. EKG as reviewed by me : EKG showed normal sinus rhythm with a rate of 86 with low voltage QRS and poor R wave progression. Imaging: None.  The patient was given 1 L bolus of IV normal saline and was started on Endo tool with NKH protocol.  He will be admitted to down unit bed for further evaluation and management. PAST MEDICAL HISTORY:   Past Medical History:  Diagnosis Date   Arteriosclerosis of coronary artery 01/29/2015   Overview:  Status post anteroapical myocardial  infarction, PCI and stent placement LAD 02/2009   Last Assessment & Plan:  Status post anteroapical myocardial infarction, PCI and stent placement LAD 02/2009. Not having significant exertional chest pain. Taking medications for coronary disease without problems. Myalgia's are not present.     Arthritis    Benign essential HTN 10/16/2014   Last Assessment & Plan:  Blood pressure has been controlled without significant dizzyness or associated fatigue. Taking antihypertensives as directed without difficulty. Improved over time.     Coronary artery disease    stent 2008   Current tear knee, lateral meniscus 11/06/2014   Family history of adverse reaction to anesthesia    HLD (hyperlipidemia)    Hypertension    Myocardial infarction [facility])    2008   Prostate cancer (HCC)    Renal mass, left     PAST SURGICAL HISTORY:   Past Surgical History:  Procedure Laterality Date   CARDIAC CATHETERIZATION     COLONOSCOPY WITH PROPOFOL  N/A 12/01/2020   Procedure: COLONOSCOPY WITH PROPOFOL ;  Surgeon: Toledo, Ladell POUR, MD;  Location: [facility] ENDOSCOPY;  Service: Gastroenterology;  Laterality: N/A;   CORONARY ANGIOPLASTY     DIAGNOSTIC LAPAROSCOPY     KNEE ARTHROSCOPY WITH MENISCAL REPAIR Right 12/03/2014   Procedure: KNEE ARTHROSCOPY WITH MENISCAL REPAIR, Partial medial meniscectomy, Lateral meniscus repair with partial meniscectomy, Abrasion chondraplasty of Femoral tropia;  Surgeon: Norleen JINNY Maltos, MD;  Location: [facility] ORS;  Service: Orthopedics;  Laterality: Right;   ROBOT ASSISTED LAPAROSCOPIC RADICAL PROSTATECTOMY      SOCIAL HISTORY:   Social History   Tobacco Use   Smoking status: Former    Current packs/day: 0.00    Average packs/day: 1 pack/day for 40.0 years (40.0 ttl pk-yrs)    Types: Cigarettes    Start date: 07/20/1968    Quit date: 07/20/2008    Years since quitting: 15.6   Smokeless tobacco: Not on file   Tobacco comments:    quit 6 years ago 2010  Substance Use Topics   Alcohol use:  Not Currently    Comment: rarely    FAMILY HISTORY:   Family History  Problem Relation Age of Onset   Benign prostatic hyperplasia Father    Kidney disease Neg Hx    Prostate cancer Neg Hx    Bladder Cancer Neg Hx    Kidney cancer Neg Hx     DRUG ALLERGIES:  No Known Allergies  REVIEW OF SYSTEMS:   ROS As per history of present illness. All pertinent systems were reviewed above. Constitutional, HEENT, cardiovascular, respiratory, GI, GU, musculoskeletal, neuro, psychiatric, endocrine, integumentary and hematologic systems were reviewed and are otherwise negative/unremarkable except for positive findings mentioned above in the HPI.   MEDICATIONS AT HOME:   Prior to Admission medications   Medication Sig Start Date End Date Taking? Authorizing Provider  aspirin 81 MG tablet Take 81 mg by mouth daily.   Yes [provider]  atorvastatin (LIPITOR) 40 MG tablet Take by mouth. 07/25/21  Yes [provider]  Ibuprofen-Diphenhydramine Cit (ADVIL PM PO) Take by mouth at bedtime.   Yes [provider]  lisinopril-hydrochlorothiazide (ZESTORETIC) 10-12.5 MG tablet Take 1 tablet by mouth daily.   Yes [provider]      VITAL SIGNS:  Blood pressure (!) 155/75, pulse 75, temperature 97.9 F (36.6 C), temperature source Oral, resp. rate 13, height 6' (1.829 m), weight 107.9 kg, SpO2 96%.  PHYSICAL EXAMINATION:  Physical Exam  GENERAL: Acutely ill 78 y.o.-year-old Caucasian male patient lying in the bed with no acute distress.  EYES: Pupils equal, round, reactive to light and accommodation. No scleral icterus. Extraocular muscles intact.  HEENT: Head atraumatic, normocephalic. Oropharynx and nasopharynx clear.  NECK:  Supple, no jugular venous distention. No thyroid enlargement, no tenderness.  LUNGS: Normal breath sounds bilaterally, no wheezing, rales,rhonchi or crepitation. No use of accessory muscles of respiration.  CARDIOVASCULAR: Regular rate  and rhythm, S1, S2 normal. No murmurs, rubs, or gallops.  ABDOMEN: Soft, nondistended, nontender. Bowel sounds present. No organomegaly or mass.  EXTREMITIES: No pedal edema, cyanosis, or clubbing.  NEUROLOGIC: Cranial nerves II through XII are intact. Muscle strength 5/5 in all extremities. Sensation intact. Gait not checked.  PSYCHIATRIC: The patient is alert and oriented x 3.  Normal affect and good eye contact. SKIN: No obvious rash, lesion, or ulcer.   LABORATORY PANEL:   CBC Recent Labs  Lab 02/28/24 2228  WBC 8.9  HGB 13.9  HCT 39.5  PLT 196   ------------------------------------------------------------------------------------------------------------------  Chemistries  Recent Labs  Lab 02/28/24 1407 02/28/24 2228 02/29/24 0025  NA 122*   < > 136  K 5.4*   < > 3.9  CL 82*   < > 98  CO2 25   < > 28  GLUCOSE 796*   < > 208*  BUN 43*   < > 32*  CREATININE 1.47*   < > 1.05  CALCIUM 9.8   < >  8.8*  AST 27  --   --   ALT 28  --   --   ALKPHOS 104  --   --   BILITOT 1.1  --   --    < > = values in this interval not displayed.   ------------------------------------------------------------------------------------------------------------------  Cardiac Enzymes No results for input(s): TROPONINI in the last 168 hours. ------------------------------------------------------------------------------------------------------------------  RADIOLOGY:  No results found.    IMPRESSION AND PLAN:  Assessment and Plan: * Uncontrolled type 2 diabetes mellitus with hyperosmolar nonketotic hyperglycemia (HCC) - The patient will be admitted to a stepdown bed. - We will continue the on IV insulin drip per EndoTool NKH protocol. - The patient will be aggressively hydrated with IV lactated ringer . - Will follow serial blood glucose measures.  Hyperkalemia - This should improve with hydration and IV insulin drip. - Will follow potassium levels.  AKI (acute kidney injury) -  This is likely prerenal due to volume depletion and dehydration. - We will follow BMP with hydration  Hyponatremia - This is likely pseudohyponatremia from hyperglycemia. - Will follow sodium level with hydration.  Essential hypertension - Will hold off Zestoretic given his acute kidney injury. - He will be placed on as needed IV labetalol.  Dyslipidemia - Will continue statin therapy.   DVT prophylaxis: Lovenox.  Advanced Care Planning:  Code Status: full code.  Family Communication:  The plan of care was discussed in details with the patient (and family). I answered all questions. The patient agreed to proceed with the above mentioned plan. Further management will depend upon hospital course. Disposition Plan: Back to previous home environment Consults called: none.  All the records are reviewed and case discussed with ED provider.  Status is: Inpatient  At the time of the admission, it appears that the appropriate admission status for this patient is inpatient.  This is judged to be reasonable and necessary in order to provide the required intensity of service to ensure the patient's safety given the presenting symptoms, physical exam findings and initial radiographic and laboratory data in the context of comorbid conditions.  The patient requires inpatient status due to high intensity of service, high risk of further deterioration and high frequency of surveillance required.  I certify that at the time of admission, it is my clinical judgment that the patient will require inpatient hospital care extending more than 2 midnights.                            Dispo: The patient is from: Home              Anticipated d/c is to: Home              Patient currently is not medically stable to d/c.              Difficult to place patient: No  Madison DELENA Peaches M.D on 02/29/2024 at 1:37 AM  [facility]   From 7 PM-7 AM, contact night-coverage [email or web address]  CC: Primary care  physician; Lenon Layman ORN, MD

## 2024-02-29 NOTE — Assessment & Plan Note (Signed)
-   The patient will be admitted to a stepdown bed. - We will continue the on IV insulin drip per EndoTool NKH protocol. - The patient will be aggressively hydrated with IV lactated ringer . - Will follow serial blood glucose measures.

## 2024-03-01 ENCOUNTER — Observation Stay

## 2024-03-01 DIAGNOSIS — N3289 Other specified disorders of bladder: Secondary | ICD-10-CM | POA: Diagnosis not present

## 2024-03-01 DIAGNOSIS — K760 Fatty (change of) liver, not elsewhere classified: Secondary | ICD-10-CM | POA: Diagnosis not present

## 2024-03-01 DIAGNOSIS — E1100 Type 2 diabetes mellitus with hyperosmolarity without nonketotic hyperglycemic-hyperosmolar coma (NKHHC): Secondary | ICD-10-CM | POA: Diagnosis not present

## 2024-03-01 LAB — CBC
HCT: 40.9 % (ref 39.0–52.0)
Hemoglobin: 13.7 g/dL (ref 13.0–17.0)
MCH: 28.3 pg (ref 26.0–34.0)
MCHC: 33.5 g/dL (ref 30.0–36.0)
MCV: 84.5 fL (ref 80.0–100.0)
Platelets: 179 K/uL (ref 150–400)
RBC: 4.84 MIL/uL (ref 4.22–5.81)
RDW: 13.5 % (ref 11.5–15.5)
WBC: 6.3 K/uL (ref 4.0–10.5)
nRBC: 0 % (ref 0.0–0.2)

## 2024-03-01 LAB — BASIC METABOLIC PANEL WITH GFR
Anion gap: 11 (ref 5–15)
BUN: 19 mg/dL (ref 8–23)
CO2: 27 mmol/L (ref 22–32)
Calcium: 8.9 mg/dL (ref 8.9–10.3)
Chloride: 96 mmol/L — ABNORMAL LOW (ref 98–111)
Creatinine, Ser: 1.06 mg/dL (ref 0.61–1.24)
GFR, Estimated: >60 mL/min (ref >60)
Glucose, Bld: 402 mg/dL — ABNORMAL HIGH (ref 70–99)
Potassium: 4.7 mmol/L (ref 3.5–5.1)
Sodium: 134 mmol/L — ABNORMAL LOW (ref 135–145)

## 2024-03-01 LAB — GLUCOSE, CAPILLARY
Glucose-Capillary: 332 mg/dL — ABNORMAL HIGH (ref 70–99)
Glucose-Capillary: 351 mg/dL — ABNORMAL HIGH (ref 70–99)
Glucose-Capillary: 307 mg/dL — ABNORMAL HIGH (ref 70–99)
Glucose-Capillary: 326 mg/dL — ABNORMAL HIGH (ref 70–99)
Glucose-Capillary: 192 mg/dL — ABNORMAL HIGH (ref 70–99)

## 2024-03-01 LAB — MAGNESIUM: Magnesium: 2.1 mg/dL (ref 1.7–2.4)

## 2024-03-01 MED ORDER — INSULIN ASPART 100 UNIT/ML IJ SOLN
5.0000 [IU] | Freq: Three times a day (TID) | INTRAMUSCULAR | Status: DC
  Administered 2024-03-01 – 2024-03-02 (×4): 5 [IU] via SUBCUTANEOUS
  Filled 2024-03-01 (×4): qty 1

## 2024-03-01 MED ORDER — INSULIN GLARGINE-YFGN 100 UNIT/ML ~~LOC~~ SOLN
25.0000 [IU] | Freq: Every day | SUBCUTANEOUS | Status: DC
  Administered 2024-03-01: 25 [IU] via SUBCUTANEOUS
  Filled 2024-03-01 (×3): qty 0.25

## 2024-03-01 NOTE — Progress Notes (Signed)
 Progress Note   Patient: Marc Macias FMW:[id number] DOB: Oct 28, 1945 DOA: 02/28/2024     1 DOS: the patient was seen and examined on 03/01/2024   Brief hospital course: Marc Macias is a 78 y.o. Caucasian male with medical history significant for type 2 diabetes mellitus, hypertension, dyslipidemia and coronary artery disease and osteoarthritis, presented to the emergency room with acute onset of malaise and lack of appetite and energy as well as polyuria and polydipsia for which she was seen by his PCP earlier today and was having elevated blood glucose level.   Admitted for further management for uncontrolled T2DM with hyperglycemia  Uncontrolled type 2 diabetes mellitus with hyperosmolar nonketotic hyperglycemia - A1c 8.4, was recently started on Rybelsus which patient has not filled in yet - Symptoms sudden onset in the last few weeks, presented with blood glucose > 800 - Inc Semglee 25 units, add Novolog 5u TID, SSI - Follow up with PCP/Endocrinology - provide referral at discharge  Normocytic anemia - Reportedly had melenic stools for the past 2 to 3 weeks, not at this time. Intermittent use of NSAID's. Denies po Iron/Pepto bismol - Hb stable today, Iron studies Consistent with ACD - PPI, follow up with PCP/GI outpatient. Patient deferred any inpatient work up - Avoid NSAIDs  Weight loss unintentional  Dyspepsia - Reports lost 20-30lbs in last 1 month, also Hx prostate cancer and new onset severe hyperglycemia - CT abd/pelvis with contrast pending - Unclear if weight loss due to severe hyperglycemia vs other etiology - Follow up with PCP/Oncology  Hyperkalemia - Resolved  AKI (acute kidney injury) - Likely prerenal due to volume depletion and dehydration. Resolved with hydration  Hyponatremia - pseudohyponatremia from hyperglycemia  Essential hypertension - resume home antihypertensives given improvement in AKI  CAD Dyslipidemia - Asa, statin  H/o prostate cancer  s/p prostatectomy and radiation - Follows with [facility]ology      Subjective: Patient was examined at the bedside, new to me today.  Spouse present at the bedside Reports feeling well today, denies any symptoms.  Blood glucose continues to be in 400, increase long-acting insulin Would like to get CT abdomen/pelvis due to unintentional weight loss, sudden increase to severe hyperglycemia and history of prostate cancer Anticipate discharge tomorrow   Physical Exam: Vitals:   03/01/24 0615 03/01/24 0712 03/01/24 0800 03/01/24 0900  BP: (!) 153/65 (!) 110/49 (!) 176/86 (!) 169/79  Pulse: (!) 59 (!) 56 83 85  Resp: 13 11 19 19   Temp:    98.1 F (36.7 C)  TempSrc:    Axillary  SpO2: 96% 93% 94% 93%  Weight:      Height:       Physical Exam  Constitutional: In no distress.  Cardiovascular: Normal rate, regular rhythm. No lower extremity edema  Pulmonary: Non labored breathing on room air, no wheezing or rales.   Abdominal: Soft. Non distended and non tender Musculoskeletal: Normal range of motion.     Neurological: Alert and oriented to person, place, and time. Non focal  Skin: Skin is warm and dry.     Data Reviewed:     Latest Ref Rng & Units 03/01/2024    8:43 AM 02/29/2024    4:07 AM 02/28/2024   10:28 PM  CBC  WBC 4.0 - 10.5 K/uL 6.3  8.6  8.9   Hemoglobin 13.0 - 17.0 g/dL 86.2  87.1  86.0   Hematocrit 39.0 - 52.0 % 40.9  37.9  39.5   Platelets 150 -  400 K/uL 179  169  196       Latest Ref Rng & Units 03/01/2024    8:43 AM 02/29/2024   12:42 PM 02/29/2024    8:36 AM  BMP  Glucose 70 - 99 mg/dL 597  660  781   BUN 8 - 23 mg/dL 19  28  27    Creatinine 0.61 - 1.24 mg/dL 8.93  9.10  9.16   Sodium 135 - 145 mmol/L 134  135  136   Potassium 3.5 - 5.1 mmol/L 4.7  4.5  3.7   Chloride 98 - 111 mmol/L 96  97  98   CO2 22 - 32 mmol/L 27  28  30    Calcium 8.9 - 10.3 mg/dL 8.9  8.9  8.7      Family Communication: Spouse at bedside   Disposition: Status is:  Observation The patient remains OBS appropriate and will d/c before 2 midnights. Patient, currently, would like to defer further work up of his weight loss and melenic stools to the outpatient setting.   Planned Discharge Destination: Home    Time spent: 35 minutes  Author: Laree Lock, MD 03/01/2024 9:47 AM  For on call review [email or web address].

## 2024-03-01 NOTE — Progress Notes (Signed)
 Report called to Candis Bull RN, charge nurse. Pt was transported by [facility] and NT on telemetry. Attached to monitor once arrived to room 141, and charge nurse aware. Wife at bedside.

## 2024-03-01 NOTE — TOC Initial Note (Signed)
 Transition of Care [facility]) - Initial/Assessment Note    Patient Details  Name: Marc Macias MRN: [id number] Date of Birth: 09-Sep-1945  [facility]) CM/SW Contact:    Corrie JINNY Ruts, LCSW Phone Number: 03/01/2024, 11:15 AM  Clinical Narrative:                 Chart reviewed. The patient was admitted for Type 2 diabetes mellitis. The patient came from home. I spoke with the patient and his wife today at bedside today. I introduced myself, my role, and reason for consult.   The patient confirmed that he has a PCP. The patient reports that he lives in the home with his wife. The patient reports that he could complete task independently before admission into the hospital.   The patient reports that he drove himself to medical appointments and his wife will assist him during discharge. The patient reports that he using [employer] and copays are affordable.  The patient reports that he has never had HH or be admitted into a SNF in the past. The patient reports that there is a walker in the home. The patient reports that he has no concerns or questions during the time of the consult.   There are no TOC needs at this time. TOC will follow the patient until D/C.     Barriers to Discharge: Continued Medical Work up   Patient Goals and CMS Choice            Expected Discharge Plan and Services       Living arrangements for the past 2 months: Single Family Home                                      Prior Living Arrangements/Services Living arrangements for the past 2 months: Single Family Home Lives with:: Spouse Patient language and need for interpreter reviewed:: Yes        Need for Family Participation in Patient Care: Yes (Comment)     Criminal Activity/Legal Involvement Pertinent to Current Situation/Hospitalization: No - Comment as needed  Activities of Daily Living   ADL Screening (condition at time of admission) Independently performs  ADLs?: Yes (appropriate for developmental age) Is the patient deaf or have difficulty hearing?: No Does the patient have difficulty seeing, even when wearing glasses/contacts?: No Does the patient have difficulty concentrating, remembering, or making decisions?: No  Permission Sought/Granted                  Emotional Assessment Appearance:: Appears stated age Attitude/Demeanor/Rapport: Gracious Affect (typically observed): Calm Orientation: : Oriented to Self, Oriented to Place, Oriented to  Time, Oriented to Situation Alcohol / Substance Use: Not Applicable Psych Involvement: No (comment)  Admission diagnosis:  Hyperglycemia [R73.9] Uncontrolled type 2 diabetes mellitus with hyperglycemia (HCC) [E11.65] Patient Active Problem List   Diagnosis Date Noted   Uncontrolled type 2 diabetes mellitus with hyperosmolar nonketotic hyperglycemia (HCC) 02/29/2024   Hyperkalemia 02/29/2024   AKI (acute kidney injury) 02/29/2024   Hyponatremia 02/29/2024   Dyslipidemia 02/29/2024   Essential hypertension 02/29/2024   Uncontrolled type 2 diabetes mellitus with hyperglycemia (HCC) 02/29/2024   Personal history of MI (myocardial infarction) 11/08/2017   Diabetes mellitus type 2 with complications (HCC) 10/26/2016   Cancer of prostate (HCC) 06/07/2015   Arteriosclerosis of coronary artery 01/29/2015   Combined fat and carbohydrate induced hyperlipemia 01/29/2015  Arthritis of knee, degenerative 12/03/2014   Current tear knee, medial meniscus 11/17/2014   Current tear knee, lateral meniscus 11/06/2014   Adult BMI 30+ 10/21/2014   Benign essential HTN 10/16/2014   PCP:  Lenon Layman ORN, MD Pharmacy:   [facility] - [place], [place] - [address] [place] [address] Phone: [phone] Fax: [phone]     Social Drivers of Health (SDOH) Social History: SDOH Screenings   Food Insecurity: No Food Insecurity (02/28/2024)  Housing: Low  Risk  (02/28/2024)  Transportation Needs: No Transportation Needs (02/28/2024)  Utilities: Not At Risk (02/28/2024)  Financial Resource Strain: Low Risk  (12/19/2023)   Received from [facility]  Social Connections: Socially Integrated (02/28/2024)  Tobacco Use: Medium Risk (02/28/2024)   SDOH Interventions:     Readmission Risk Interventions     No data to display

## 2024-03-01 NOTE — Inpatient Diabetes Management (Signed)
 Inpatient Diabetes Program Recommendations  AACE/ADA: New Consensus Statement on Inpatient Glycemic Control (2015)  Target Ranges:  Prepandial:   less than 140 mg/dL      Peak postprandial:   less than 180 mg/dL (1-2 hours)      Critically ill patients:  140 - 180 mg/dL    Admit with: Uncontrolled type 2 diabetes mellitus with hyperosmolar nonketotic hyperglycemia/ Hyperkalemia  History: DM2  Home DM Meds: None Listed       Rybeleus 3 mg daily (listed in PCP notes)  Current Orders: Novolog Resistant Correction Scale/ SSI (0-20 units) TID AC + HS      Semglee 15 units qhs  Glucerna tid between meals  Per notes, lost 40# in one month He has had increased thirst and urination  PCP: Dr. Lenon with [facility]   Likely needs Insulin for home given Glucose was 796 on admission Lantus $35 co-pay Novolog $35 co-pay  MD- Increase Semglee to 25 units -   Also add Novolog 5 units tid meal coverage if eating >50% of meals (Note pt on glucerna supplement contributing to postprandial glucose spikes)   --Will follow patient during hospitalization--  Clotilda Bull RN, MSN, BC-ADM Inpatient Diabetes Coordinator Team Pager [phone] (8a-5p)

## 2024-03-01 NOTE — Plan of Care (Signed)

## 2024-03-02 ENCOUNTER — Other Ambulatory Visit: Payer: Self-pay

## 2024-03-02 DIAGNOSIS — E1100 Type 2 diabetes mellitus with hyperosmolarity without nonketotic hyperglycemic-hyperosmolar coma (NKHHC): Secondary | ICD-10-CM | POA: Diagnosis not present

## 2024-03-02 LAB — BASIC METABOLIC PANEL WITH GFR
Anion gap: 10 (ref 5–15)
BUN: 18 mg/dL (ref 8–23)
CO2: 26 mmol/L (ref 22–32)
Calcium: 8.8 mg/dL — ABNORMAL LOW (ref 8.9–10.3)
Chloride: 99 mmol/L (ref 98–111)
Creatinine, Ser: 0.88 mg/dL (ref 0.61–1.24)
GFR, Estimated: >60 mL/min (ref >60)
Glucose, Bld: 191 mg/dL — ABNORMAL HIGH (ref 70–99)
Potassium: 3.5 mmol/L (ref 3.5–5.1)
Sodium: 135 mmol/L (ref 135–145)

## 2024-03-02 LAB — GLUCOSE, CAPILLARY
Glucose-Capillary: 260 mg/dL — ABNORMAL HIGH (ref 70–99)
Glucose-Capillary: 388 mg/dL — ABNORMAL HIGH (ref 70–99)

## 2024-03-02 MED ORDER — BLOOD GLUCOSE TEST VI STRP
1.0000 | ORAL_STRIP | 0 refills | Status: AC
  Filled 2024-03-02: qty 100, 25d supply, fill #0

## 2024-03-02 MED ORDER — POTASSIUM CHLORIDE CRYS ER 20 MEQ PO TBCR
40.0000 meq | EXTENDED_RELEASE_TABLET | Freq: Once | ORAL | Status: AC
  Administered 2024-03-02: 40 meq via ORAL
  Filled 2024-03-02: qty 2

## 2024-03-02 MED ORDER — LANTUS SOLOSTAR 100 UNIT/ML ~~LOC~~ SOPN
25.0000 [IU] | PEN_INJECTOR | Freq: Every day | SUBCUTANEOUS | 1 refills | Status: AC
  Filled 2024-03-02: qty 6, 24d supply, fill #0

## 2024-03-02 MED ORDER — PANTOPRAZOLE SODIUM 40 MG PO TBEC
40.0000 mg | DELAYED_RELEASE_TABLET | Freq: Every day | ORAL | 0 refills | Status: AC
  Filled 2024-03-02: qty 30, 30d supply, fill #0

## 2024-03-02 MED ORDER — LANCETS MISC
1.0000 | 0 refills | Status: AC
  Filled 2024-03-02: qty 100, 25d supply, fill #0
  Filled 2024-03-02: qty 100, fill #0

## 2024-03-02 MED ORDER — LANCET DEVICE MISC
1.0000 | 0 refills | Status: AC
  Filled 2024-03-02: qty 1, fill #0

## 2024-03-02 MED ORDER — BLOOD GLUCOSE MONITOR SYSTEM W/DEVICE KIT
1.0000 | PACK | 0 refills | Status: AC
  Filled 2024-03-02: qty 1, 30d supply, fill #0

## 2024-03-02 MED ORDER — INSULIN PEN NEEDLE 32G X 4 MM MISC
Freq: Four times a day (QID) | 1 refills | Status: AC
  Filled 2024-03-02: qty 100, 25d supply, fill #0

## 2024-03-02 MED ORDER — PANTOPRAZOLE SODIUM 40 MG PO TBEC
40.0000 mg | DELAYED_RELEASE_TABLET | Freq: Every day | ORAL | Status: DC
  Administered 2024-03-02: 40 mg via ORAL
  Filled 2024-03-02: qty 1

## 2024-03-02 MED ORDER — INSULIN ASPART 100 UNIT/ML FLEXPEN
8.0000 [IU] | PEN_INJECTOR | Freq: Three times a day (TID) | SUBCUTANEOUS | 1 refills | Status: AC
  Filled 2024-03-02: qty 6, 25d supply, fill #0

## 2024-03-02 NOTE — Plan of Care (Signed)
  Problem: Education: Goal: Knowledge of General Education information will improve Description: Including pain rating scale, medication(s)/side effects and non-pharmacologic comfort measures Outcome: Progressing   Problem: Activity: Goal: Risk for activity intolerance will decrease Outcome: Progressing   Problem: Nutrition: Goal: Adequate nutrition will be maintained Outcome: Progressing   Problem: Elimination: Goal: Will not experience complications related to bowel motility Outcome: Progressing   Problem: Pain Managment: Goal: General experience of comfort will improve and/or be controlled Outcome: Progressing   Problem: Safety: Goal: Ability to remain free from injury will improve Outcome: Progressing   Problem: Skin Integrity: Goal: Risk for impaired skin integrity will decrease Outcome: Progressing

## 2024-03-02 NOTE — Progress Notes (Signed)
 Reviewed discharge instructions with patient. Patient acknowledged understanding. Patient received meds to bedside. Patient discharged with personal belongings. Patient wheeled out by staff. Patient transported home via family vehicle. No distress noted in patient.

## 2024-03-02 NOTE — Discharge Summary (Signed)
 Physician Discharge Summary   Patient: Marc Macias MRN: [id number] DOB: 02-25-1946  Admit date:     02/28/2024  Discharge date: 03/02/24  Discharge Physician: Laree Lock   PCP: Lenon Layman ORN, MD   Recommendations at discharge:   Follow up with PCP within 1 week - Repeat BMP, Mag, CBC Monitor CBG  Follow up with Endocrinology outpatient - referral provided Follow up with oncology as [facility]  Discharge Diagnoses: Principal Problem:   Uncontrolled type 2 diabetes mellitus with hyperosmolar nonketotic hyperglycemia (HCC) Active Problems:   Hyperkalemia   AKI (acute kidney injury)   Hyponatremia   Dyslipidemia   Essential hypertension   Uncontrolled type 2 diabetes mellitus with hyperglycemia [facility])  Hospital Course: Marc Macias is a 78 y.o. Caucasian male with medical history significant for type 2 diabetes mellitus, hypertension, dyslipidemia and coronary artery disease and osteoarthritis, presented to the emergency room with acute onset of malaise and lack of appetite and energy as well as polyuria and polydipsia for which she was seen by his PCP earlier today and was having elevated blood glucose level.   Admitted for further management for uncontrolled T2DM with hyperglycemia. Hospital course as below  Uncontrolled type 2 diabetes mellitus with hyperosmolar nonketotic hyperglycemia - A1c 8.4, was recently started on Rybelsus which patient has not filled in yet - Symptoms sudden onset in the last few weeks, presented with blood glucose > 800 - Discharge on Semglee 25 units, Novolog 8u TID - Follow up with PCP/Endocrinology - provided referral   Normocytic anemia - Reportedly had melenic stools for the past 2 to 3 weeks, not at this time. Intermittent use of NSAID's. Denies po Iron/Pepto bismol - Hb stable, Iron studies Consistent with ACD - PPI, follow up with PCP outpatient. Patient deferred any inpatient work up - Avoid NSAIDs   Weight loss unintentional   Dyspepsia - Reports lost 20-30lbs in last 1 month, also Hx prostate cancer and new onset severe hyperglycemia - Occasional alcohol use, Former smoker - CT abd/pelvis shows findings suspicious for cystitis, UA negative.  No other abnormality - Unclear if weight loss due to severe hyperglycemia vs other etiology - Follow up with PCP/Oncology   Hyperkalemia - Resolved   AKI -resolved - Likely prerenal due to volume depletion and dehydration. Resolved with hydration   Hyponatremia - pseudohyponatremia from hyperglycemia   Essential hypertension - resume home antihypertensives given improvement in AKI   CAD Dyslipidemia - Asa, statin   H/o prostate cancer s/p prostatectomy and radiation - Follows with [facility] oncology    Consultants: None Procedures performed: None  Disposition: Home Diet recommendation:  Discharge Diet Orders (From admission, onward)     Start     Ordered   03/02/24 0000  Diet Carb Modified        03/02/24 1152            DISCHARGE MEDICATION: Allergies as of 03/02/2024   No Known Allergies      Medication List     TAKE these medications    aspirin 81 MG tablet Take 81 mg by mouth daily.   atorvastatin 40 MG tablet Commonly known as: LIPITOR Take by mouth.   Blood Glucose Monitoring Suppl Devi 1 each by Does not apply route as directed. Dispense based on patient and insurance preference. Use up to four times daily as directed. (FOR ICD-10 E10.9, E11.9).   BLOOD GLUCOSE TEST STRIPS Strp 1 each by Does not apply route as directed. Dispense based on patient  and insurance preference. Use up to four times daily as directed. (FOR ICD-10 E10.9, E11.9).   insulin aspart 100 UNIT/ML FlexPen Commonly known as: NOVOLOG Inject 8 Units into the skin 3 (three) times daily with meals.   insulin glargine-yfgn 100 UNIT/ML Pen Commonly known as: SEMGLEE Inject 25 Units into the skin at bedtime.   Lancet Device Misc 1 Box by Does not apply route  as directed. Dispense based on patient and insurance preference. Use up to four times daily as directed. (FOR ICD-10 E10.9, E11.9).   Lancets Misc 1 each by Does not apply route as directed. Dispense based on patient and insurance preference. Use up to four times daily as directed. (FOR ICD-10 E10.9, E11.9).   lisinopril-hydrochlorothiazide 10-12.5 MG tablet Commonly known as: ZESTORETIC Take 1 tablet by mouth daily.   pantoprazole 40 MG tablet Commonly known as: PROTONIX Take 1 tablet (40 mg total) by mouth daily. Start taking on: March 03, 2024        Discharge Exam: [name]   02/28/24 1404 02/28/24 2157  Weight: 109.3 kg 107.9 kg   Constitutional: In no distress Cardiovascular: Normal rate, regular rhythm. No lower extremity edema  Pulmonary: Non labored breathing on room air, no wheezing or rales. Abdominal: Soft. Non distended and non tender Musculoskeletal: Normal range of motion.     Neurological: Alert and oriented to person, place, and time. Non focal  Skin: Skin is warm and dry  Condition at discharge: good  The results of significant diagnostics from this hospitalization (including imaging, microbiology, ancillary and laboratory) are listed below for reference.   Imaging Studies: CT ABDOMEN PELVIS W CONTRAST Result Date: 03/01/2024 EXAM: CT ABDOMEN AND PELVIS WITH CONTRAST 03/01/2024 04:59:19 PM TECHNIQUE: CT of the abdomen and pelvis was performed with the administration of 100 mL of iohexol (OMNIPAQUE) 300 MG/ML solution. Multiplanar reformatted images are provided for review. Automated exposure control, iterative reconstruction, and/or weight-based adjustment of the mA/kV was utilized to reduce the radiation dose to as low as reasonably achievable. COMPARISON: None available. CLINICAL HISTORY: r/o malignancy, with sudden onset hyperglycemia, weight loss and Hx prostate cancer. FINDINGS: LOWER CHEST: No acute abnormality. LIVER: Hepatic steatosis.  GALLBLADDER AND BILE DUCTS: Gallbladder is unremarkable. No biliary ductal dilatation. SPLEEN: No acute abnormality. PANCREAS: No acute abnormality. ADRENAL GLANDS: No acute abnormality. KIDNEYS, URETERS AND BLADDER: Hypoattenuating lesions in both kidneys are too small to definitively characterize but statistically likely to represent benign cysts. No follow up recommended. Additional low density cyst in the upper pole of the left kidney does not require follow up. No stones in the kidneys or ureters. No hydronephrosis. No perinephric or periureteral stranding. Bladder wall thickening and perivesical stranding. Evaluation is limited due to underdistention. Correlate with urinalysis for cystitis. GI AND BOWEL: Stomach demonstrates no acute abnormality. There is no bowel obstruction. PERITONEUM AND RETROPERITONEUM: No ascites. No free air. VASCULATURE: Aorta is normal in caliber. Aortic atherosclerotic calcification. LYMPH NODES: No lymphadenopathy. REPRODUCTIVE ORGANS: Prostatectomy. BONES AND SOFT TISSUES: No acute osseous abnormality. No focal soft tissue abnormality. IMPRESSION: 1. Findings suspicious for cystitis, with bladder wall thickening and perivesical stranding; assessment limited by underdistention. 2. Otherwise unremarkable CT of the abdomen and pelvis. Electronically signed by: Norman Gatlin MD 03/01/2024 07:00 PM EDT RP Workstation: [id number]    Microbiology: Results for orders placed or performed during the hospital encounter of 02/28/24  MRSA Next Gen by PCR, Nasal     Status: None   Collection Time: 02/28/24 10:03 PM   Specimen:  Nasal Mucosa; Nasal Swab  Result Value Ref Range Status   MRSA by PCR Next Gen NOT DETECTED NOT DETECTED Final    Comment: (NOTE) The GeneXpert MRSA Assay (FDA approved for NASAL specimens only), is one component of a comprehensive MRSA colonization surveillance program. It is not intended to diagnose MRSA infection nor to guide or monitor treatment for MRSA  infections. Test performance is not FDA approved in patients less than 14 years old. Performed at [facility], [address] [place], [place] [address]     Labs: CBC: Recent Labs  Lab 02/28/24 1407 02/28/24 2228 02/29/24 0407 03/01/24 0843  WBC 11.3* 8.9 8.6 6.3  NEUTROABS 9.4*  --   --   --   HGB 14.8 13.9 12.8* 13.7  HCT 42.3 39.5 37.9* 40.9  MCV 82.8 81.6 84.6 84.5  PLT 227 196 169 179   Basic Metabolic Panel: Recent Labs  Lab 02/29/24 0407 02/29/24 0836 02/29/24 1242 03/01/24 0843 03/02/24 0430  NA 135 136 135 134* 135  K 3.5 3.7 4.5 4.7 3.5  CL 98 98 97* 96* 99  CO2 27 30 28 27 26   GLUCOSE 178* 218* 339* 402* 191*  BUN 32* 27* 28* 19 18  CREATININE 0.92 0.83 0.89 1.06 0.88  CALCIUM 8.7* 8.7* 8.9 8.9 8.8*  MG  --   --   --  2.1  --    Liver Function Tests: Recent Labs  Lab 02/28/24 1407  AST 27  ALT 28  ALKPHOS 104  BILITOT 1.1  PROT 7.9  ALBUMIN 4.2   CBG: Recent Labs  Lab 03/01/24 1242 03/01/24 1711 03/01/24 2132 03/02/24 0737 03/02/24 1127  GLUCAP 307* 326* 192* 260* 388*    Discharge time spent: greater than 30 minutes.  Signed: Laree Lock, MD [facility] 03/02/2024

## 2024-03-03 ENCOUNTER — Other Ambulatory Visit: Payer: Self-pay | Admitting: Internal Medicine

## 2024-03-03 DIAGNOSIS — I2510 Atherosclerotic heart disease of native coronary artery without angina pectoris: Secondary | ICD-10-CM

## 2024-03-03 DIAGNOSIS — R634 Abnormal weight loss: Secondary | ICD-10-CM

## 2024-03-03 LAB — HEMOGLOBIN A1C
Hgb A1c MFr Bld: 14.4 % — ABNORMAL HIGH (ref 4.8–5.6)
Mean Plasma Glucose: 367 mg/dL

## 2024-03-05 DIAGNOSIS — Z6833 Body mass index (BMI) 33.0-33.9, adult: Secondary | ICD-10-CM | POA: Diagnosis not present

## 2024-03-05 DIAGNOSIS — Z794 Long term (current) use of insulin: Secondary | ICD-10-CM | POA: Diagnosis not present

## 2024-03-05 DIAGNOSIS — E1165 Type 2 diabetes mellitus with hyperglycemia: Secondary | ICD-10-CM | POA: Diagnosis not present

## 2024-03-05 DIAGNOSIS — E119 Type 2 diabetes mellitus without complications: Secondary | ICD-10-CM | POA: Diagnosis not present

## 2024-03-05 DIAGNOSIS — I10 Essential (primary) hypertension: Secondary | ICD-10-CM | POA: Diagnosis not present

## 2024-03-12 DIAGNOSIS — E1165 Type 2 diabetes mellitus with hyperglycemia: Secondary | ICD-10-CM | POA: Diagnosis not present

## 2024-03-14 DIAGNOSIS — E782 Mixed hyperlipidemia: Secondary | ICD-10-CM | POA: Diagnosis not present

## 2024-03-14 DIAGNOSIS — E1100 Type 2 diabetes mellitus with hyperosmolarity without nonketotic hyperglycemic-hyperosmolar coma (NKHHC): Secondary | ICD-10-CM | POA: Diagnosis not present

## 2024-03-14 DIAGNOSIS — I10 Essential (primary) hypertension: Secondary | ICD-10-CM | POA: Diagnosis not present

## 2024-03-14 DIAGNOSIS — Z6835 Body mass index (BMI) 35.0-35.9, adult: Secondary | ICD-10-CM | POA: Diagnosis not present

## 2024-04-02 DIAGNOSIS — E119 Type 2 diabetes mellitus without complications: Secondary | ICD-10-CM | POA: Diagnosis not present

## 2024-04-23 DIAGNOSIS — I6523 Occlusion and stenosis of bilateral carotid arteries: Secondary | ICD-10-CM | POA: Diagnosis not present
